# Patient Record
Sex: Female | Born: 1965 | Race: Black or African American | Hispanic: No | Marital: Single | Smoking: Never smoker
Health system: Southern US, Community
[De-identification: ages and names within clinical notes are randomized; demographics above are authoritative.]

## PROBLEM LIST (undated history)

## (undated) DIAGNOSIS — D219 Benign neoplasm of connective and other soft tissue, unspecified: Secondary | ICD-10-CM

## (undated) DIAGNOSIS — M549 Dorsalgia, unspecified: Secondary | ICD-10-CM

## (undated) DIAGNOSIS — J45909 Unspecified asthma, uncomplicated: Secondary | ICD-10-CM

## (undated) DIAGNOSIS — I10 Essential (primary) hypertension: Secondary | ICD-10-CM

## (undated) DIAGNOSIS — G5600 Carpal tunnel syndrome, unspecified upper limb: Secondary | ICD-10-CM

## (undated) DIAGNOSIS — E559 Vitamin D deficiency, unspecified: Secondary | ICD-10-CM

## (undated) HISTORY — DX: Dorsalgia, unspecified: M54.9

## (undated) HISTORY — DX: Unspecified asthma, uncomplicated: J45.909

## (undated) HISTORY — DX: Benign neoplasm of connective and other soft tissue, unspecified: D21.9

## (undated) HISTORY — PX: MYOMECTOMY: SHX85

## (undated) HISTORY — DX: Vitamin D deficiency, unspecified: E55.9

## (undated) HISTORY — DX: Carpal tunnel syndrome, unspecified upper limb: G56.00

---

## 1999-01-16 ENCOUNTER — Other Ambulatory Visit: Admission: RE | Admit: 1999-01-16 | Discharge: 1999-01-16 | Payer: Self-pay | Admitting: Internal Medicine

## 1999-06-21 ENCOUNTER — Other Ambulatory Visit: Admission: RE | Admit: 1999-06-21 | Discharge: 1999-06-21 | Payer: Self-pay | Admitting: Internal Medicine

## 1999-07-04 ENCOUNTER — Encounter (HOSPITAL_BASED_OUTPATIENT_CLINIC_OR_DEPARTMENT_OTHER): Payer: Self-pay | Admitting: General Surgery

## 1999-07-06 ENCOUNTER — Ambulatory Visit (HOSPITAL_COMMUNITY): Admission: RE | Admit: 1999-07-06 | Discharge: 1999-07-06 | Payer: Self-pay | Admitting: General Surgery

## 1999-09-12 ENCOUNTER — Encounter: Payer: Self-pay | Admitting: Emergency Medicine

## 1999-09-12 ENCOUNTER — Emergency Department (HOSPITAL_COMMUNITY): Admission: EM | Admit: 1999-09-12 | Discharge: 1999-09-12 | Payer: Self-pay | Admitting: Emergency Medicine

## 1999-10-11 ENCOUNTER — Encounter: Payer: Self-pay | Admitting: *Deleted

## 1999-10-11 ENCOUNTER — Emergency Department (HOSPITAL_COMMUNITY): Admission: EM | Admit: 1999-10-11 | Discharge: 1999-10-11 | Payer: Self-pay | Admitting: Emergency Medicine

## 1999-10-15 ENCOUNTER — Emergency Department (HOSPITAL_COMMUNITY): Admission: EM | Admit: 1999-10-15 | Discharge: 1999-10-15 | Payer: Self-pay | Admitting: Emergency Medicine

## 2003-09-12 ENCOUNTER — Encounter: Admission: RE | Admit: 2003-09-12 | Discharge: 2003-09-12 | Payer: Self-pay | Admitting: Internal Medicine

## 2004-05-10 ENCOUNTER — Encounter: Admission: RE | Admit: 2004-05-10 | Discharge: 2004-05-10 | Payer: Self-pay | Admitting: Obstetrics and Gynecology

## 2004-05-12 ENCOUNTER — Encounter: Admission: RE | Admit: 2004-05-12 | Discharge: 2004-05-12 | Payer: Self-pay | Admitting: Interventional Radiology

## 2004-05-28 ENCOUNTER — Ambulatory Visit (HOSPITAL_COMMUNITY): Admission: RE | Admit: 2004-05-28 | Discharge: 2004-05-28 | Payer: Self-pay | Admitting: Interventional Radiology

## 2004-05-31 ENCOUNTER — Observation Stay (HOSPITAL_COMMUNITY): Admission: RE | Admit: 2004-05-31 | Discharge: 2004-06-01 | Payer: Self-pay | Admitting: Interventional Radiology

## 2004-10-24 ENCOUNTER — Emergency Department (HOSPITAL_COMMUNITY): Admission: EM | Admit: 2004-10-24 | Discharge: 2004-10-24 | Payer: Self-pay | Admitting: Emergency Medicine

## 2004-11-08 ENCOUNTER — Encounter: Admission: RE | Admit: 2004-11-08 | Discharge: 2004-11-08 | Payer: Self-pay | Admitting: Obstetrics and Gynecology

## 2005-03-25 ENCOUNTER — Emergency Department (HOSPITAL_COMMUNITY): Admission: EM | Admit: 2005-03-25 | Discharge: 2005-03-25 | Payer: Self-pay | Admitting: *Deleted

## 2014-10-14 ENCOUNTER — Encounter: Payer: Self-pay | Admitting: Family Medicine

## 2014-10-14 ENCOUNTER — Ambulatory Visit (INDEPENDENT_AMBULATORY_CARE_PROVIDER_SITE_OTHER): Payer: BLUE CROSS/BLUE SHIELD | Admitting: Family Medicine

## 2014-10-14 VITALS — BP 124/82 | HR 58 | Temp 98.4°F | Resp 16 | Ht 64.5 in | Wt 211.6 lb

## 2014-10-14 DIAGNOSIS — G629 Polyneuropathy, unspecified: Secondary | ICD-10-CM

## 2014-10-14 DIAGNOSIS — Z1389 Encounter for screening for other disorder: Secondary | ICD-10-CM | POA: Diagnosis not present

## 2014-10-14 DIAGNOSIS — Z136 Encounter for screening for cardiovascular disorders: Secondary | ICD-10-CM | POA: Diagnosis not present

## 2014-10-14 DIAGNOSIS — Z0000 Encounter for general adult medical examination without abnormal findings: Secondary | ICD-10-CM

## 2014-10-14 DIAGNOSIS — Z124 Encounter for screening for malignant neoplasm of cervix: Secondary | ICD-10-CM

## 2014-10-14 DIAGNOSIS — Z1383 Encounter for screening for respiratory disorder NEC: Secondary | ICD-10-CM | POA: Diagnosis not present

## 2014-10-14 DIAGNOSIS — Z1321 Encounter for screening for nutritional disorder: Secondary | ICD-10-CM

## 2014-10-14 DIAGNOSIS — G5602 Carpal tunnel syndrome, left upper limb: Secondary | ICD-10-CM | POA: Diagnosis not present

## 2014-10-14 DIAGNOSIS — Z1329 Encounter for screening for other suspected endocrine disorder: Secondary | ICD-10-CM | POA: Diagnosis not present

## 2014-10-14 DIAGNOSIS — R740 Nonspecific elevation of levels of transaminase and lactic acid dehydrogenase [LDH]: Secondary | ICD-10-CM

## 2014-10-14 DIAGNOSIS — R7401 Elevation of levels of liver transaminase levels: Secondary | ICD-10-CM

## 2014-10-14 DIAGNOSIS — Z119 Encounter for screening for infectious and parasitic diseases, unspecified: Secondary | ICD-10-CM

## 2014-10-14 DIAGNOSIS — M62838 Other muscle spasm: Secondary | ICD-10-CM | POA: Diagnosis not present

## 2014-10-14 DIAGNOSIS — E559 Vitamin D deficiency, unspecified: Secondary | ICD-10-CM

## 2014-10-14 DIAGNOSIS — M545 Low back pain, unspecified: Secondary | ICD-10-CM

## 2014-10-14 LAB — POCT URINALYSIS DIPSTICK
Blood, UA: NEGATIVE
Glucose, UA: NEGATIVE
Ketones, UA: NEGATIVE
Leukocytes, UA: NEGATIVE
Nitrite, UA: NEGATIVE
Protein, UA: NEGATIVE
Spec Grav, UA: 1.015
Urobilinogen, UA: 4
pH, UA: 7

## 2014-10-14 LAB — POCT WET PREP WITH KOH
KOH Prep POC: NEGATIVE
RBC Wet Prep HPF POC: NEGATIVE
TRICHOMONAS UA: NEGATIVE
YEAST WET PREP PER HPF POC: NEGATIVE

## 2014-10-14 LAB — COMPREHENSIVE METABOLIC PANEL
ALK PHOS: 119 U/L — AB (ref 39–117)
ALT: 613 U/L — ABNORMAL HIGH (ref 0–35)
AST: 685 U/L — ABNORMAL HIGH (ref 0–37)
Albumin: 3.9 g/dL (ref 3.5–5.2)
BILIRUBIN TOTAL: 1.3 mg/dL — AB (ref 0.2–1.2)
BUN: 14 mg/dL (ref 6–23)
CO2: 27 meq/L (ref 19–32)
Calcium: 9 mg/dL (ref 8.4–10.5)
Chloride: 106 mEq/L (ref 96–112)
Creat: 0.96 mg/dL (ref 0.50–1.10)
GLUCOSE: 65 mg/dL — AB (ref 70–99)
Potassium: 4 mEq/L (ref 3.5–5.3)
Sodium: 141 mEq/L (ref 135–145)
TOTAL PROTEIN: 6.7 g/dL (ref 6.0–8.3)

## 2014-10-14 LAB — CBC
HCT: 38.7 % (ref 36.0–46.0)
HEMOGLOBIN: 13.5 g/dL (ref 12.0–15.0)
MCH: 29.5 pg (ref 26.0–34.0)
MCHC: 34.9 g/dL (ref 30.0–36.0)
MCV: 84.5 fL (ref 78.0–100.0)
MPV: 9.7 fL (ref 8.6–12.4)
PLATELETS: 251 10*3/uL (ref 150–400)
RBC: 4.58 MIL/uL (ref 3.87–5.11)
RDW: 13.5 % (ref 11.5–15.5)
WBC: 4.7 10*3/uL (ref 4.0–10.5)

## 2014-10-14 LAB — HEPATITIS C ANTIBODY: HCV AB: NEGATIVE

## 2014-10-14 LAB — VITAMIN D 25 HYDROXY (VIT D DEFICIENCY, FRACTURES): VIT D 25 HYDROXY: 19 ng/mL — AB (ref 30–100)

## 2014-10-14 LAB — HIV ANTIBODY (ROUTINE TESTING W REFLEX): HIV: NONREACTIVE

## 2014-10-14 LAB — POCT GLYCOSYLATED HEMOGLOBIN (HGB A1C): HEMOGLOBIN A1C: 5.6

## 2014-10-14 LAB — RPR

## 2014-10-14 LAB — TSH: TSH: 0.903 u[IU]/mL (ref 0.350–4.500)

## 2014-10-14 MED ORDER — CYCLOBENZAPRINE HCL 10 MG PO TABS: 10.0000 mg | ORAL_TABLET | Freq: Every day | ORAL | Status: DC

## 2014-10-14 MED ORDER — MELOXICAM 15 MG PO TABS: 15.0000 mg | ORAL_TABLET | Freq: Every day | ORAL | Status: DC

## 2014-10-14 NOTE — Progress Notes (Deleted)
   Subjective:    Patient ID: Megan Gardner, female    DOB: [birth year], 49 y.o.   MRN: [id number]  HPI    Review of Systems  Constitutional: Negative.   HENT: Negative.   Eyes: Negative.   Respiratory: Negative.   Cardiovascular: Negative.   Gastrointestinal: Negative.   Endocrine: Negative.   Genitourinary: Negative.   Musculoskeletal: Positive for back pain and arthralgias.  Skin: Negative.   Allergic/Immunologic: Negative.   Neurological: Negative.   Hematological: Negative.   Psychiatric/Behavioral: Negative.        Objective:   Physical Exam        Assessment & Plan:

## 2014-10-14 NOTE — Patient Instructions (Signed)
\Carpal Tunnel Syndrome Carpal tunnel syndrome is a disorder of the nervous system in the wrist that causes pain, hand weakness, and/or loss of feeling. Carpal tunnel syndrome is caused by the compression, stretching, or irritation of the median nerve at the wrist joint. Athletes who experience carpal tunnel syndrome may notice a decrease in their performance to the condition, especially for sports that require strong hand or wrist action.  SYMPTOMS   Tingling, numbness, or burning pain in the hand or fingers.  Inability to sleep due to pain in the hand.  Sharp pains that shoot from the wrist up the arm or to the fingers, especially at night.  Morning stiffness or cramping of the hand.  Thumb weakness, resulting in difficulty holding objects or making a fist.  Shiny, dry skin on the hand.  Reduced performance in any sport requiring a strong grip. CAUSES   Median nerve damage at the wrist is caused by pressure due to swelling, inflammation, or scarred tissue.  Sources of pressure include:  Repetitive gripping or squeezing that causes inflammation of the tendon sheaths.  Scarring or shortening of the ligament that covers the median nerve.  Traumatic injury to the wrist or forearm such as fracture, sprain, or dislocation.  Prolonged hyperextension (wrist bent backward) or hyperflexion (wrist bent downward) of the wrist. RISK INCREASES WITH:  Diabetes mellitus.  Menopause or amenorrhea.  Rheumatoid arthritis.  Raynaud disease.  Pregnancy.  Gout.  Kidney disease.  Ganglion cyst.  Repetitive hand or wrist action.  Hypothyroidism (underactive thyroid gland).  Repetitive jolting or shaking of the hands or wrist.  Prolonged forceful weight-bearing on the hands. PREVENTION  Bracing the hand and wrist straight during activities that involve repetitive grasping.  For activities that require prolonged extension of the wrist (bending towards the top of the forearm)  periodically change the position of your wrists.  Learn and use proper technique in activities that result in the wrist position in neutral to slight extension.  Avoid bending the wrist into full extension or flexion (up or down).  Keep the wrist in a straight (neutral) position. To keep the wrist in this position, wear a splint.  Avoid repetitive hand and wrist motions.  When possible avoid prolonged grasping of items (steering wheel of a car, a pen, a vacuum cleaner, or a rake).  Loosen your grip for activities that require prolonged grasping of items.  Place keyboards and writing surfaces at the correct height as to decrease strain on the wrist and hand.  Alternate work tasks to avoid prolonged wrist flexion.  Avoid pinching activities (needlework and writing) as they may irritate your carpal tunnel syndrome.  If these activities are necessary, complete them for shorter periods of time.  When writing, use a felt tip or rollerball pen and/or build up the grip on a pen to decrease the forces required for writing. PROGNOSIS  Carpal tunnel syndrome is usually curable with appropriate conservative treatment and sometimes resolves spontaneously. For some cases, surgery is necessary, especially if muscle wasting or nerve changes have developed.  RELATED COMPLICATIONS   Permanent numbness and a weak thumb or fingers in the affected hand.  Permanent paralysis of a portion of the hand and fingers. TREATMENT  Treatment initially consists of stopping activities that aggravate the symptoms as well as medication and ice to reduce inflammation. A wrist splint is often recommended for wear during activities of repetitive motion as well as at night. It is also important to learn and use proper technique when  performing activities that typically cause pain. On occasion, a corticosteroid injection may be given. If symptoms persist despite conservative treatment, surgery may be an option. Surgical  techniques free the pinched or compressed nerve. Carpal tunnel surgery is usually performed on an outpatient basis, meaning you go home the same day as surgery. These procedures provide almost complete relief of all symptoms in 95% of patients. Expect at least 2 weeks for healing after surgery. For cases that are the result of repeated jolting or shaking of the hand or wrist or prolonged hyperextension, surgery is not usually recommended because stretching of the median nerve, not compression, is usually the cause of carpal tunnel syndrome in these cases. MEDICATION   If pain medication is necessary, nonsteroidal anti-inflammatory medications, such as aspirin and ibuprofen, or other minor pain relievers, such as acetaminophen, are often recommended.  Do not take pain medication for 7 days before surgery.  Prescription pain relievers are usually only prescribed after surgery. Use only as directed and only as much as you need.  Corticosteroid injections may be given to reduce inflammation. However, they are not always recommended.  Vitamin B6 (pyridoxine) may reduce symptoms; use only if prescribed for your disorder. SEEK MEDICAL CARE IF:   Symptoms get worse or do not improve in 2 weeks despite treatment.  You also have a current or recent history of neck or shoulder injury that has resulted in pain or tingling elsewhere in your arm. Document Released: 04/08/2005 Document Revised: 08/23/2013 Document Reviewed: 07/21/2008 [facility] Patient Information 2015 [place], [place]. This information is not intended to replace advice given to you by your health care provider. Make sure you discuss any questions you have with your health care provider. Health Maintenance Adopting a healthy lifestyle and getting preventive care can go a long way to promote health and wellness. Talk with your health care provider about what schedule of regular examinations is right for you. This is a good chance for you to check  in with your provider about disease prevention and staying healthy. In between checkups, there are plenty of things you can do on your own. Experts have done a lot of research about which lifestyle changes and preventive measures are most likely to keep you healthy. Ask your health care provider for more information. WEIGHT AND DIET  Eat a healthy diet  Be sure to include plenty of vegetables, fruits, low-fat dairy products, and lean protein.  Do not eat a lot of foods high in solid fats, added sugars, or salt.  Get regular exercise. This is one of the most important things you can do for your health.  Most adults should exercise for at least 150 minutes each week. The exercise should increase your heart rate and make you sweat (moderate-intensity exercise).  Most adults should also do strengthening exercises at least twice a week. This is in addition to the moderate-intensity exercise.  Maintain a healthy weight  Body mass index (BMI) is a measurement that can be used to identify possible weight problems. It estimates body fat based on height and weight. Your health care provider can help determine your BMI and help you achieve or maintain a healthy weight.  For females 42 years of age and older:   A BMI below 18.5 is considered underweight.  A BMI of 18.5 to 24.9 is normal.  A BMI of 25 to 29.9 is considered overweight.  A BMI of 30 and above is considered obese.  Watch levels of cholesterol and blood lipids  You should start having your blood tested for lipids and cholesterol at 49 years of age, then have this test every 5 years.  You may need to have your cholesterol levels checked more often if:  Your lipid or cholesterol levels are high.  You are older than 49 years of age.  You are at high risk for heart disease.  CANCER SCREENING   Lung Cancer  Lung cancer screening is recommended for adults 70-71 years old who are at high risk for lung cancer because of a  history of smoking.  A yearly low-dose CT scan of the lungs is recommended for people who:  Currently smoke.  Have quit within the past 15 years.  Have at least a 30-pack-year history of smoking. A pack year is smoking an average of one pack of cigarettes a day for 1 year.  Yearly screening should continue until it has been 15 years since you quit.  Yearly screening should stop if you develop a health problem that would prevent you from having lung cancer treatment.  Breast Cancer  Practice breast self-awareness. This means understanding how your breasts normally appear and feel.  It also means doing regular breast self-exams. Let your health care provider know about any changes, no matter how small.  If you are in your 20s or 30s, you should have a clinical breast exam (CBE) by a health care provider every 1-3 years as part of a regular health exam.  If you are 14 or older, have a CBE every year. Also consider having a breast X-ray (mammogram) every year.  If you have a family history of breast cancer, talk to your health care provider about genetic screening.  If you are at high risk for breast cancer, talk to your health care provider about having an MRI and a mammogram every year.  Breast cancer gene (BRCA) assessment is recommended for women who have family members with BRCA-related cancers. BRCA-related cancers include:  Breast.  Ovarian.  Tubal.  Peritoneal cancers.  Results of the assessment will determine the need for genetic counseling and BRCA1 and BRCA2 testing. Cervical Cancer Routine pelvic examinations to screen for cervical cancer are no longer recommended for nonpregnant women who are considered low risk for cancer of the pelvic organs (ovaries, uterus, and vagina) and who do not have symptoms. A pelvic examination may be necessary if you have symptoms including those associated with pelvic infections. Ask your health care provider if a screening pelvic exam  is right for you.   The Pap test is the screening test for cervical cancer for women who are considered at risk.  If you had a hysterectomy for a problem that was not cancer or a condition that could lead to cancer, then you no longer need Pap tests.  If you are older than 65 years, and you have had normal Pap tests for the past 10 years, you no longer need to have Pap tests.  If you have had past treatment for cervical cancer or a condition that could lead to cancer, you need Pap tests and screening for cancer for at least 20 years after your treatment.  If you no longer get a Pap test, assess your risk factors if they change (such as having a new sexual partner). This can affect whether you should start being screened again.  Some women have medical problems that increase their chance of getting cervical cancer. If this is the case for you, your health care provider may recommend more  frequent screening and Pap tests.  The human papillomavirus (HPV) test is another test that may be used for cervical cancer screening. The HPV test looks for the virus that can cause cell changes in the cervix. The cells collected during the Pap test can be tested for HPV.  The HPV test can be used to screen women 70 years of age and older. Getting tested for HPV can extend the interval between normal Pap tests from three to five years.  An HPV test also should be used to screen women of any age who have unclear Pap test results.  After 49 years of age, women should have HPV testing as often as Pap tests.  Colorectal Cancer  This type of cancer can be detected and often prevented.  Routine colorectal cancer screening usually begins at 49 years of age and continues through 49 years of age.  Your health care provider may recommend screening at an earlier age if you have risk factors for colon cancer.  Your health care provider may also recommend using home test kits to check for hidden blood in the  stool.  A small camera at the end of a tube can be used to examine your colon directly (sigmoidoscopy or colonoscopy). This is done to check for the earliest forms of colorectal cancer.  Routine screening usually begins at age 6.  Direct examination of the colon should be repeated every 5-10 years through 49 years of age. However, you may need to be screened more often if early forms of precancerous polyps or small growths are found. Skin Cancer  Check your skin from head to toe regularly.  Tell your health care provider about any new moles or changes in moles, especially if there is a change in a mole's shape or color.  Also tell your health care provider if you have a mole that is larger than the size of a pencil eraser.  Always use sunscreen. Apply sunscreen liberally and repeatedly throughout the day.  Protect yourself by wearing long sleeves, pants, a wide-brimmed hat, and sunglasses whenever you are outside. HEART DISEASE, DIABETES, AND HIGH BLOOD PRESSURE   Have your blood pressure checked at least every 1-2 years. High blood pressure causes heart disease and increases the risk of stroke.  If you are between 55 years and 35 years old, ask your health care provider if you should take aspirin to prevent strokes.  Have regular diabetes screenings. This involves taking a blood sample to check your fasting blood sugar level.  If you are at a normal weight and have a low risk for diabetes, have this test once every three years after 49 years of age.  If you are overweight and have a high risk for diabetes, consider being tested at a younger age or more often. PREVENTING INFECTION  Hepatitis B  If you have a higher risk for hepatitis B, you should be screened for this virus. You are considered at high risk for hepatitis B if:  You were born in a country where hepatitis B is common. Ask your health care provider which countries are considered high risk.  Your parents were born in  a high-risk country, and you have not been immunized against hepatitis B (hepatitis B vaccine).  You have HIV or AIDS.  You use needles to inject street drugs.  You live with someone who has hepatitis B.  You have had sex with someone who has hepatitis B.  You get hemodialysis treatment.  You take certain  medicines for conditions, including cancer, organ transplantation, and autoimmune conditions. Hepatitis C  Blood testing is recommended for:  Everyone born from [birth year] through 1965.  Anyone with known risk factors for hepatitis C. Sexually transmitted infections (STIs)  You should be screened for sexually transmitted infections (STIs) including gonorrhea and chlamydia if:  You are sexually active and are younger than 49 years of age.  You are older than 49 years of age and your health care provider tells you that you are at risk for this type of infection.  Your sexual activity has changed since you were last screened and you are at an increased risk for chlamydia or gonorrhea. Ask your health care provider if you are at risk.  If you do not have HIV, but are at risk, it may be recommended that you take a prescription medicine daily to prevent HIV infection. This is called pre-exposure prophylaxis (PrEP). You are considered at risk if:  You are sexually active and do not regularly use condoms or know the HIV status of your partner(s).  You take drugs by injection.  You are sexually active with a partner who has HIV. Talk with your health care provider about whether you are at high risk of being infected with HIV. If you choose to begin PrEP, you should first be tested for HIV. You should then be tested every 3 months for as long as you are taking PrEP.  PREGNANCY   If you are premenopausal and you may become pregnant, ask your health care provider about preconception counseling.  If you may become pregnant, take 400 to 800 micrograms (mcg) of folic acid every day.  If you  want to prevent pregnancy, talk to your health care provider about birth control (contraception). OSTEOPOROSIS AND MENOPAUSE   Osteoporosis is a disease in which the bones lose minerals and strength with aging. This can result in serious bone fractures. Your risk for osteoporosis can be identified using a bone density scan.  If you are 60 years of age or older, or if you are at risk for osteoporosis and fractures, ask your health care provider if you should be screened.  Ask your health care provider whether you should take a calcium or vitamin D supplement to lower your risk for osteoporosis.  Menopause may have certain physical symptoms and risks.  Hormone replacement therapy may reduce some of these symptoms and risks. Talk to your health care provider about whether hormone replacement therapy is right for you.  HOME CARE INSTRUCTIONS   Schedule regular health, dental, and eye exams.  Stay current with your immunizations.   Do not use any tobacco products including cigarettes, chewing tobacco, or electronic cigarettes.  If you are pregnant, do not drink alcohol.  If you are breastfeeding, limit how much and how often you drink alcohol.  Limit alcohol intake to no more than 1 drink per day for nonpregnant women. One drink equals 12 ounces of beer, 5 ounces of wine, or 1 ounces of hard liquor.  Do not use street drugs.  Do not share needles.  Ask your health care provider for help if you need support or information about quitting drugs.  Tell your health care provider if you often feel depressed.  Tell your health care provider if you have ever been abused or do not feel safe at home. Document Released: 10/22/2010 Document Revised: 08/23/2013 Document Reviewed: 03/10/2013 [facility] Patient Information 2015 [place], [place]. This information is not intended to replace advice given to you  by your health care provider. Make sure you discuss any questions you have with your health  care provider. Back Pain, Adult Low back pain is very common. About 1 in 5 people have back pain.The cause of low back pain is rarely dangerous. The pain often gets better over time.About half of people with a sudden onset of back pain feel better in just 2 weeks. About 8 in 10 people feel better by 6 weeks.  CAUSES Some common causes of back pain include:  Strain of the muscles or ligaments supporting the spine.  Wear and tear (degeneration) of the spinal discs.  Arthritis.  Direct injury to the back. DIAGNOSIS Most of the time, the direct cause of low back pain is not known.However, back pain can be treated effectively even when the exact cause of the pain is unknown.Answering your caregiver's questions about your overall health and symptoms is one of the most accurate ways to make sure the cause of your pain is not dangerous. If your caregiver needs more information, he or she may order lab work or imaging tests (X-rays or MRIs).However, even if imaging tests show changes in your back, this usually does not require surgery. HOME CARE INSTRUCTIONS For many people, back pain returns.Since low back pain is rarely dangerous, it is often a condition that people can learn to [facility] their own.   Remain active. It is stressful on the back to sit or stand in one place. Do not sit, drive, or stand in one place for more than 30 minutes at a time. Take short walks on level surfaces as soon as pain allows.Try to increase the length of time you walk each day.  Do not stay in bed.Resting more than 1 or 2 days can delay your recovery.  Do not avoid exercise or work.Your body is made to move.It is not dangerous to be active, even though your back may hurt.Your back will likely heal faster if you return to being active before your pain is gone.  Pay attention to your body when you bend and lift. Many people have less discomfortwhen lifting if they bend their knees, keep the load close to  their bodies,and avoid twisting. Often, the most comfortable positions are those that put less stress on your recovering back.  Find a comfortable position to sleep. Use a firm mattress and lie on your side with your knees slightly bent. If you lie on your back, put a pillow under your knees.  Only take over-the-counter or prescription medicines as directed by your caregiver. Over-the-counter medicines to reduce pain and inflammation are often the most helpful.Your caregiver may prescribe muscle relaxant drugs.These medicines help dull your pain so you can more quickly return to your normal activities and healthy exercise.  Put ice on the injured area.  Put ice in a plastic bag.  Place a towel between your skin and the bag.  Leave the ice on for 15-20 minutes, 03-04 times a day for the first 2 to 3 days. After that, ice and heat may be alternated to reduce pain and spasms.  Ask your caregiver about trying back exercises and gentle massage. This may be of some benefit.  Avoid feeling anxious or stressed.Stress increases muscle tension and can worsen back pain.It is important to recognize when you are anxious or stressed and learn ways to manage it.Exercise is a great option. SEEK MEDICAL CARE IF:  You have pain that is not relieved with rest or medicine.  You have pain that  does not improve in 1 week.  You have new symptoms.  You are generally not feeling well. SEEK IMMEDIATE MEDICAL CARE IF:   You have pain that radiates from your back into your legs.  You develop new bowel or bladder control problems.  You have unusual weakness or numbness in your arms or legs.  You develop nausea or vomiting.  You develop abdominal pain.  You feel faint. Document Released: 04/08/2005 Document Revised: 10/08/2011 Document Reviewed: 08/10/2013 [facility] Patient Information 2015 [place], [place]. This information is not intended to replace advice given to you by your health care provider.  Make sure you discuss any questions you have with your health care provider.

## 2014-10-14 NOTE — Progress Notes (Addendum)
Subjective:    Patient ID: Megan Gardner, female    DOB: 02-25-1966, 49 y.o.   MRN: [id number] This chart was scribed for Megan Cheadle, MD by Megan Gardner, Medical Scribe. This patient was seen in Room 25 and the patient's care was started at 9:16 AM.   Chief Complaint  Patient presents with  . Annual Exam    HPI HPI Comments: Megan Gardner is a 49 y.o. female who presents to the [facility] for a complete physical exam. Patient is not fasting today. She ate some chicken soup this morning.  Here for CPE. I have not seen pt prior and she is completley new to [facility] as well. She does not have any info in [facility] system other than ER reports from 2000-2006.  Patient is not on any regular medications.   Back Pain: Patient reports having right-sided lower back pain that started 1-2 months ago. The pain is worse in the mornings and tends to resolve throughout the day. She has been taking Icy Hot and Aleve with relief. She did take Aleve this morning, so she does not have pain currently. Patient denies leg pain, weakness, numbness, and bowel or bladder symptoms. She also denies prior back injuries and prior imaging of her back. She has not seen a chiropractor. Patient does note having an old mattress and believes it is time to get a new one.  Tingling in Fingers: She also reports having some occasional tingling in all of her fingers that started about 2 months ago. Patient denies dropping things, losing grasp, and weakness. She does not do much typing or writing.  Cancer Screening: No history of abnormal pap smears per patient. Her PCP is Dr. Karlton Gardner.  STD Testing: Patient would like to be tested for STD's. She denies any urinary symptoms and vaginal symptoms. She typically has one episode of nocturia at baseline.  Immunizations: She believes her last tetanus shot was 2 years ago.  Vitamins/Supplements: She does take a One-A-Day Women's multivitamin daily.  Exercise: Patient plays  football for exercise.  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  History reviewed. No pertinent past medical history. History reviewed. No pertinent past surgical history. History reviewed. No pertinent family history.  History   Social History  . Marital Status: Single    Spouse Name: N/A  . Number of Children: N/A  . Years of Education: N/A   Occupational History  . Not on file.   Social History Main Topics  . Smoking status: Never Smoker   . Smokeless tobacco: Not on file  . Alcohol Use: 0.6 oz/week    0 Standard drinks or equivalent, 1 Glasses of wine per week  . Drug Use: No  . Sexual Activity: Not on file   Other Topics Concern  . Not on file   Social History Narrative  . No narrative on file    No current outpatient prescriptions on file prior to visit.   No current facility-administered medications on file prior to visit.   No Known Allergies   Review of Systems  Musculoskeletal: Positive for back pain and arthralgias.  All other systems reviewed and are negative.  13 point ROS reviewed on patient health survey. Negative other than listed above or in nursing note. See nursing note.      Objective:  BP 124/82 mmHg  Gardner 58  Temp(Src) 98.4 F (36.9 C) (Oral)  Resp 16  Ht 5' 4.5" (1.638 m)  Wt 211 lb  9.6 oz (95.981 kg)  BMI 35.77 kg/m2  SpO2 100%  LMP 10/03/2014  Physical Exam  Constitutional: She is oriented to person, place, and time. She appears well-developed and well-nourished. No distress.  HENT:  Head: Normocephalic and atraumatic.  Right Ear: Tympanic membrane is injected and erythematous.  Left Ear: Tympanic membrane is injected and erythematous.  Nose: Mucosal edema and rhinorrhea present.  Mouth/Throat: Oropharynx is clear and moist. No oropharyngeal exudate.  Nasal mucosal edema and purulent rhinorrhea. Oropharynx with postnasal drip.  Eyes: Pupils are equal, round, and reactive to light.  Neck: Neck supple. No thyromegaly  present.  Cardiovascular: Normal rate, regular rhythm, S1 normal, S2 normal and normal heart sounds.   No murmur heard. Pulses:      Radial pulses are 2+ on the right side, and 2+ on the left side.  2+ ulnar pulses bilaterally.  Pulmonary/Chest: Effort normal and breath sounds normal. No respiratory distress. She has no wheezes. She has no rales.  Clear to auscultation bilaterally. Good air movement.  Genitourinary: Uterus normal. Vaginal discharge found.  Several symmetric, mobile, non-tender, 1 cm nodules bilaterally, most over lower half and outer quadrant of breasts, consistent with fibrocystic breasts. Normal labia minora and majora. Small amount of thick, white, frothy vaginal discharge. Normal vaginal vault and cervix. Normal uterus and adnexa on bimanual exam.  Musculoskeletal: She exhibits no edema.  Negative straight leg raise bilaterally. Palpable spasm above iliac crest bilaterally. No paraspinal tenderness.  Lymphadenopathy:    She has no cervical adenopathy.       Right: No supraclavicular adenopathy present.       Left: No supraclavicular adenopathy present.  Neurological: She is alert and oriented to person, place, and time. No cranial nerve deficit.  Reflex Scores:      Tricep reflexes are 1+ on the right side and 1+ on the left side.      Bicep reflexes are 1+ on the right side and 1+ on the left side.      Brachioradialis reflexes are 1+ on the right side and 1+ on the left side.      Patellar reflexes are 2+ on the right side and 2+ on the left side.      Achilles reflexes are 2+ on the right side and 2+ on the left side. Positive Tinel's on the left, negative on the right. Negative Phalen's and reverse Phalen's. Normal thenar eminence. Opposition grasp strength 5/5.  Skin: Skin is warm and dry. No rash noted.  Hyperpigmented 1 cm nodule on right medial ankle.  Psychiatric: She has a normal mood and affect. Her behavior is normal.  Vitals reviewed.  [facility] reading  (PRIMARY) by  Dr. Brigitte Gardner. EKG NSR, no ischemic change     Assessment & Plan:  Give patient a copy of the physical per her request. 1. Routine health maintenance   2. Screening for cardiovascular, respiratory, and genitourinary diseases   3. Screening examination for infectious disease   4. Screening for cervical cancer - no h/o abnml, done 1 yr prior - recheck 2018  5. Screening for thyroid disorder   6. Encounter for vitamin deficiency screening   7. Right-sided low back pain without sciatica   8. Muscle spasm   9. Carpal tunnel syndrome of left wrist - wear qhs splint given in office  10. Neuropathy     Orders Placed This Encounter  Procedures  . GC/Chlamydia Probe Amp  . CBC  . Comprehensive metabolic panel    Order Specific  Question:  Has the patient fasted?    Answer:  Yes  . TSH  . Vit D  25 hydroxy (rtn osteoporosis monitoring)  . RPR  . Hepatitis C antibody  . HIV antibody  . Lipid panel    Standing Status: Future     Number of Occurrences:      Standing Expiration Date: 10/14/2015    Order Specific Question:  Has the patient fasted?    Answer:  Yes  . POCT urinalysis dipstick  . POCT glycosylated hemoglobin (Hb A1C)  . POCT Wet Prep with KOH  . EKG 12-Lead    Meds ordered this encounter  Medications  . cyclobenzaprine (FLEXERIL) 10 MG tablet    Sig: Take 1 tablet (10 mg total) by mouth at bedtime.    Dispense:  30 tablet    Refill:  1  . meloxicam (MOBIC) 15 MG tablet    Sig: Take 1 tablet (15 mg total) by mouth daily. Do not use with any other otc pain medication other than acetaminophen    Dispense:  30 tablet    Refill:  1    Do not use with any other otc pain medication other than tylenol/acetaminophen - so no aleve, ibuprofen, motrin, advil, etc.    I personally performed the services described in this documentation, which was scribed in my presence. The recorded information has been reviewed and considered, and addended by me as needed.  Megan Cheadle,  MD MPH

## 2014-10-14 NOTE — Progress Notes (Deleted)
Patient ID: Megan Gardner, female   DOB: 1965-06-14, 49 y.o.   MRN: [id number] Here for CPE.  I have not seen pt prior and she is completley new to [facility] as well. She does not have any info in [facility] system other than ER reports from 2000-2006.

## 2014-10-15 LAB — GC/CHLAMYDIA PROBE AMP
CT Probe RNA: NEGATIVE
GC Probe RNA: NEGATIVE

## 2014-10-20 ENCOUNTER — Other Ambulatory Visit (INDEPENDENT_AMBULATORY_CARE_PROVIDER_SITE_OTHER): Payer: BLUE CROSS/BLUE SHIELD

## 2014-10-20 DIAGNOSIS — Z1383 Encounter for screening for respiratory disorder NEC: Secondary | ICD-10-CM

## 2014-10-20 DIAGNOSIS — Z1389 Encounter for screening for other disorder: Secondary | ICD-10-CM | POA: Diagnosis not present

## 2014-10-20 DIAGNOSIS — Z136 Encounter for screening for cardiovascular disorders: Secondary | ICD-10-CM

## 2014-10-20 LAB — LIPID PANEL
Cholesterol: 149 mg/dL (ref 0–200)
HDL: 51 mg/dL (ref >=46)
LDL Cholesterol: 93 mg/dL (ref 0–99)
Total CHOL/HDL Ratio: 2.9 Ratio
Triglycerides: 27 mg/dL (ref <150)
VLDL: 5 mg/dL (ref 0–40)

## 2014-10-20 NOTE — Addendum Note (Signed)
Addended by: Delman Cheadle on: 10/20/2014 01:03 AM   Modules accepted: Miquel Dunn

## 2014-10-20 NOTE — Progress Notes (Unsigned)
Pt is here for lab work only. 

## 2014-10-22 ENCOUNTER — Encounter: Payer: Self-pay | Admitting: Family Medicine

## 2014-10-22 DIAGNOSIS — G5602 Carpal tunnel syndrome, left upper limb: Secondary | ICD-10-CM | POA: Insufficient documentation

## 2014-10-22 DIAGNOSIS — R740 Nonspecific elevation of levels of transaminase and lactic acid dehydrogenase [LDH]: Secondary | ICD-10-CM

## 2014-10-22 DIAGNOSIS — E559 Vitamin D deficiency, unspecified: Secondary | ICD-10-CM | POA: Insufficient documentation

## 2014-10-22 DIAGNOSIS — R7401 Elevation of levels of liver transaminase levels: Secondary | ICD-10-CM | POA: Insufficient documentation

## 2014-10-22 MED ORDER — ERGOCALCIFEROL 1.25 MG (50000 UT) PO CAPS: 50000.0000 [IU] | ORAL_CAPSULE | ORAL | Status: DC

## 2014-10-22 NOTE — Addendum Note (Signed)
Addended by: Delman Cheadle on: 10/22/2014 06:41 AM   Modules accepted: Orders, SmartSet

## 2014-10-24 ENCOUNTER — Telehealth: Payer: Self-pay | Admitting: *Deleted

## 2014-10-25 ENCOUNTER — Ambulatory Visit (INDEPENDENT_AMBULATORY_CARE_PROVIDER_SITE_OTHER): Payer: BLUE CROSS/BLUE SHIELD | Admitting: Family Medicine

## 2014-10-25 ENCOUNTER — Encounter: Payer: Self-pay | Admitting: Family Medicine

## 2014-10-25 VITALS — BP 136/86 | HR 73 | Temp 98.3°F | Resp 15 | Ht 64.5 in | Wt 211.0 lb

## 2014-10-25 DIAGNOSIS — B9689 Other specified bacterial agents as the cause of diseases classified elsewhere: Secondary | ICD-10-CM

## 2014-10-25 DIAGNOSIS — N760 Acute vaginitis: Secondary | ICD-10-CM

## 2014-10-25 DIAGNOSIS — E559 Vitamin D deficiency, unspecified: Secondary | ICD-10-CM | POA: Diagnosis not present

## 2014-10-25 DIAGNOSIS — R740 Nonspecific elevation of levels of transaminase and lactic acid dehydrogenase [LDH]: Secondary | ICD-10-CM | POA: Diagnosis not present

## 2014-10-25 DIAGNOSIS — A499 Bacterial infection, unspecified: Secondary | ICD-10-CM | POA: Diagnosis not present

## 2014-10-25 DIAGNOSIS — R7401 Elevation of levels of liver transaminase levels: Secondary | ICD-10-CM

## 2014-10-25 LAB — HEPATIC FUNCTION PANEL
ALBUMIN: 3.6 g/dL (ref 3.5–5.2)
ALK PHOS: 72 U/L (ref 39–117)
ALT: 32 U/L (ref 0–35)
AST: 18 U/L (ref 0–37)
BILIRUBIN DIRECT: 0.1 mg/dL (ref 0.0–0.3)
BILIRUBIN TOTAL: 0.5 mg/dL (ref 0.2–1.2)
Indirect Bilirubin: 0.4 mg/dL (ref 0.2–1.2)
Total Protein: 6.2 g/dL (ref 6.0–8.3)

## 2014-10-25 LAB — C-REACTIVE PROTEIN

## 2014-10-25 LAB — GAMMA GT: GGT: 91 U/L — ABNORMAL HIGH (ref 7–51)

## 2014-10-25 LAB — CK: Total CK: 245 U/L — ABNORMAL HIGH (ref 7–177)

## 2014-10-25 LAB — LIPASE: LIPASE: 13 U/L (ref 0–75)

## 2014-10-25 MED ORDER — METRONIDAZOLE 500 MG PO TABS: 500.0000 mg | ORAL_TABLET | Freq: Two times a day (BID) | ORAL | Status: DC

## 2014-10-25 NOTE — Progress Notes (Signed)
Subjective:    Patient ID: Megan Gardner, female    DOB: 07-03-1965, 49 y.o.   MRN: [id number] This chart was scribed for Delman Cheadle, MD by Zola Button, Medical Scribe. This patient was seen in Room 2 and the patient's care was started at 1:33 PM.   Chief Complaint  Patient presents with  . Follow-up    Back pain and blood work    HPI HPI Comments: Megan Gardner is a 49 y.o. female with a hx of vitamin D deficiency who presents to the [facility] for a follow-up.  Patient does drink beer frequently, but she states she does not have a problem stopping. No history of alcohol addition per patient. At her complete physical 2 weeks ago, she was found to have vitamin D deficiency with vitamin D of 19, started on high-dose replacement. She was found to have bacterial vaginosis which has not been treated. Normal A1c, TSH, RPR, and GC chlamydia. Negative hepatitis C and HIV. Normal UA and CBC. Normal liver function, but AST was 685, ALT 613. She is unsure if she was vaccinated against hepatitis A or B. She was not fasting at that time, but she is fasting today. She came in a week after her visit for a fasting lipid panel that was normal. Triglycerides were 27. She has not used any Tylenol, but she uses Aleve for aches and pains. No personal or family history of liver problems. No change to skin tone, abdominal distention, bowel sounds or urination.  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  No past medical history on file. No past surgical history on file. Current Outpatient Prescriptions on File Prior to Visit  Medication Sig Dispense Refill  . cyclobenzaprine (FLEXERIL) 10 MG tablet Take 1 tablet (10 mg total) by mouth at bedtime. 30 tablet 1  . ergocalciferol (VITAMIN D2) 50000 UNITS capsule Take 1 capsule (50,000 Units total) by mouth once a week. 12 capsule 1  . meloxicam (MOBIC) 15 MG tablet Take 1 tablet (15 mg total) by mouth daily. Do not use  with any other otc pain medication other than acetaminophen 30 tablet 1   No current facility-administered medications on file prior to visit.   No Known Allergies No family history on file. History   Social History  . Marital Status: Single    Spouse Name: N/A  . Number of Children: N/A  . Years of Education: N/A   Social History Main Topics  . Smoking status: Never Smoker   . Smokeless tobacco: Not on file  . Alcohol Use: 0.6 oz/week    0 Standard drinks or equivalent, 1 Glasses of wine per week  . Drug Use: No  . Sexual Activity: Not on file   Other Topics Concern  . None   Social History Narrative     Review of Systems  Constitutional: Negative for fever, chills, activity change, appetite change and unexpected weight change.  Gastrointestinal: Negative.  Negative for nausea, vomiting, abdominal pain, diarrhea, constipation and abdominal distention.  Genitourinary: Negative.  Negative for urgency, decreased urine volume and pelvic pain.  Musculoskeletal: Positive for back pain.  Skin: Negative for color change.  Hematological: Negative for adenopathy. Does not bruise/bleed easily.  Psychiatric/Behavioral: Negative for self-injury and dysphoric mood.       Objective:  BP 136/86 mmHg  Pulse 73  Temp(Src) 98.3 F (36.8 C) (Oral)  Resp 15  Ht 5' 4.5" (1.638 m)  Wt 211 lb (95.709 kg)  BMI 35.67 kg/m2  SpO2 99%  LMP 10/23/2014  Physical Exam  Constitutional: She is oriented to person, place, and time. She appears well-developed and well-nourished. No distress.  HENT:  Head: Normocephalic and atraumatic.  Mouth/Throat: Oropharynx is clear and moist. No oropharyngeal exudate.  Eyes: Pupils are equal, round, and reactive to light.  Neck: Neck supple.  Cardiovascular: Normal rate.   Pulmonary/Chest: Effort normal.  Musculoskeletal: She exhibits no edema.  Neurological: She is alert and oriented to person, place, and time. No cranial nerve deficit.  Skin: Skin is  warm and dry. No rash noted.  Poorly defined subcutaneous mass approximately 1 cm at left upper quadrant near midline, palpable on exam. Mobile and non-tender. No fluctuance or induration. Patient had not noticed prior.  Psychiatric: She has a normal mood and affect. Her behavior is normal.  Vitals reviewed.         Assessment & Plan:   1. Transaminitis - unknown etiology - occ EtOH and tylenol but in no significant amount per pt.  Has stopped both completely for the past wk so recheck lfts today along with viral hep panel.  [place] P.  2. Vitamin D deficiency   3. Bacterial vaginosis - pt asymptomatic - was found on wet prep at her CPE 2 wks ago - start flagyl    Orders Placed This Encounter  Procedures  . US Abdomen Limited RUQ    211LBS/NO NEEDS/INS/BCBS/HB/PT W/EPIC    Standing Status: Future     Number of Occurrences:      Standing Expiration Date: 12/26/2015    Order Specific Question:  Reason for Exam (SYMPTOM  OR DIAGNOSIS REQUIRED)    Answer:  elevated ast/alt    Order Specific Question:  Preferred imaging location?    Answer:  [facility]  . Hepatic Function Panel  . Hepatitis B surface antigen  . Hepatitis B surface antibody  . Hepatitis A Antibody, IGM  . Hepatitis A Antibody, Total  . Hepatitis B core antibody, total  . Gamma GT  . Lipase  . Aldolase  . CK  . ANA  . Hepatitis C RNA quantitative  . Sedimentation Rate  . C-reactive protein  . Ambulatory referral to Gastroenterology    Referral Priority:  Routine    Referral Type:  Consultation    Referral Reason:  Specialty Services Required    Number of Visits Requested:  1    Meds ordered this encounter  Medications  . metroNIDAZOLE (FLAGYL) 500 MG tablet    Sig: Take 1 tablet (500 mg total) by mouth 2 (two) times daily.    Dispense:  14 tablet    Refill:  0    I personally performed the services described in this documentation, which was scribed in my presence. The recorded information has been  reviewed and considered, and addended by me as needed.  Delman Cheadle, MD MPH  Results for orders placed or performed in visit on 10/25/14  Hepatic Function Panel  Result Value Ref Range   Total Bilirubin 0.5 0.2 - 1.2 mg/dL   Bilirubin, Direct 0.1 0.0 - 0.3 mg/dL   Indirect Bilirubin 0.4 0.2 - 1.2 mg/dL   Alkaline Phosphatase 72 39 - 117 U/L   AST 18 0 - 37 U/L   ALT 32 0 - 35 U/L   Total Protein 6.2 6.0 - 8.3 g/dL   Albumin 3.6 3.5 - 5.2 g/dL  Hepatitis B surface antigen  Result Value Ref  Range   Hepatitis B Surface Ag NEGATIVE NEGATIVE  Hepatitis B surface antibody  Result Value Ref Range   Hepatitis B-Post 554.0 mIU/mL  Hepatitis A Antibody, IGM  Result Value Ref Range   Hep A IgM NON REACTIVE NON REACTIVE  Hepatitis A Antibody, Total  Result Value Ref Range   Hep A Total Ab REACTIVE (A) NON REACTIVE  Hepatitis B core antibody, total  Result Value Ref Range   Hep B Core Total Ab NON REACTIVE NON REACTIVE  Gamma GT  Result Value Ref Range   GGT 91 (H) 7 - 51 U/L  Lipase  Result Value Ref Range   Lipase 13 0 - 75 U/L  CK  Result Value Ref Range   Total CK 245 (H) 7 - 177 U/L  ANA  Result Value Ref Range   Anit Nuclear Antibody(ANA) NEG NEGATIVE  Sedimentation Rate  Result Value Ref Range   Sed Rate 5 0 - 20 mm/hr  C-reactive protein  Result Value Ref Range   CRP <0.5 <0.60 mg/dL

## 2014-10-25 NOTE — Patient Instructions (Signed)
Bacterial Vaginosis Bacterial vaginosis is a vaginal infection that occurs when the normal balance of bacteria in the vagina is disrupted. It results from an overgrowth of certain bacteria. This is the most common vaginal infection in women of childbearing age. Treatment is important to prevent complications, especially in pregnant women, as it can cause a premature delivery. CAUSES  Bacterial vaginosis is caused by an increase in harmful bacteria that are normally present in smaller amounts in the vagina. Several different kinds of bacteria can cause bacterial vaginosis. However, the reason that the condition develops is not fully understood. RISK FACTORS Certain activities or behaviors can put you at an increased risk of developing bacterial vaginosis, including:  Having a new sex partner or multiple sex partners.  Douching.  Using an intrauterine device (IUD) for contraception. Women do not get bacterial vaginosis from toilet seats, bedding, swimming pools, or contact with objects around them. SIGNS AND SYMPTOMS  Some women with bacterial vaginosis have no signs or symptoms. Common symptoms include:  Grey vaginal discharge.  A fishlike odor with discharge, especially after sexual intercourse.  Itching or burning of the vagina and vulva.  Burning or pain with urination. DIAGNOSIS  Your health care provider will take a medical history and examine the vagina for signs of bacterial vaginosis. A sample of vaginal fluid may be taken. Your health care provider will look at this sample under a microscope to check for bacteria and abnormal cells. A vaginal pH test may also be done.  TREATMENT  Bacterial vaginosis may be treated with antibiotic medicines. These may be given in the form of a pill or a vaginal cream. A second round of antibiotics may be prescribed if the condition comes back after treatment.  HOME CARE INSTRUCTIONS   Only take over-the-counter or prescription medicines as  directed by your health care provider.  If antibiotic medicine was prescribed, take it as directed. Make sure you finish it even if you start to feel better.  Do not have sex until treatment is completed.  Tell all sexual partners that you have a vaginal infection. They should see their health care provider and be treated if they have problems, such as a mild rash or itching.  Practice safe sex by using condoms and only having one sex partner. SEEK MEDICAL CARE IF:   Your symptoms are not improving after 3 days of treatment.  You have increased discharge or pain.  You have a fever. MAKE SURE YOU:   Understand these instructions.  Will watch your condition.  Will get help right away if you are not doing well or get worse. FOR MORE INFORMATION  Centers for Disease Control and Prevention, Division of STD Prevention: [employer] American Sexual Health Association (ASHA): www.ashastd.org  Document Released: 04/08/2005 Document Revised: 01/27/2013 Document Reviewed: 11/18/2012 [facility] Patient Information 2015 [place], [place]. This information is not intended to replace advice given to you by your health care provider. Make sure you discuss any questions you have with your health care provider.  Liver Profile A liver profile is a battery of tests which helps your caregiver evaluate your liver function. The following tests are often included in the liver profile: Alanine aminotransferase (ALT or SGPT) This is an enzyme found primarily in the liver. Abnormalities may represent liver disease. This is found in cells of the liver so when it is elevated, it has been released by damaged cells. Albumin - The serum albumin is one of the major proteins in the blood and a reflection  of the general state of nutrition. This is low when the liver is unable to do its job. It is also low when protein is lost in the urine. NORMAL FINDINGS Adult/Elderly  Total protein: 6.4-8.3 g/dL or 64-83 g/L (SI  units)  Albumin: 3.5-5 g/dL or 35-50 g/L (SI units)  Globulin: 2.3-3.4 g/dL  Alpha1 globulin: 0.1-0.3 g/dL or 1-3 g/L (SI units)  Alpha2 globulin: 0.6-1 g/dL or 6-10 g/L (SI units)  Beta globulin: 0.7-1.1 g/dL or 7-11 g/L (SI units) Children  Total protein  Premature infant: 4.2-7.6 g/dL  Newborn: 4.6-7.4 g/dL  Infant: 6-6.7 g/dL  Child: 6.2-8 g/dL  Albumin  Premature infant: 3-4.2 g/dL  Newborn: 3.5-5.4 g/dL  Infant: 4.4-5.4 g/dL  Child: 4-5.9 g/dL Albumin/Globulin ratio - Calculated by dividing the albumin by the globulin. It is a measure of well being.  Alkaline phosphatase - This is an enzyme which is important in diagnosing proper bone and liver functions. NORMAL FINDINGS Age / Normal Value (units/L)  0-5 days / 35-140  Less than 3 yr / 15-60  3-6 yr / 15-50  6-12 yr / 10-50  12-18 yr / 10-40  Adult / 0-35 units/L or 0-0.58 microKat/L (SI Units) (Females tend to have slightly lower levels than males)  Elderly / Slightly higher than adults Aspartate aminotransferase (AST or SGOT) - an enzyme found in skeletal and heart muscle, liver and other organs. Abnormalities may represent liver disease. This is found in cells of the liver so when it is elevated, it has been released by damaged cells. Bilirubin, Total: A chemical involved with liver functions. High concentrations may result in jaundice. Jaundice is a yellowing of the skin and the whites of the eyes. NORMAL FINDINGS Blood  Adult/elderly/child  Total bilirubin: 0.3-1.0 mg/dL or 5.1-17 micromole/L (SI units)  Indirect bilirubin: 0.2-0.8 mg/dL or 3.4-12.0 micromole/L (SI units)  Direct bilirubin: 0.1-0.3 mg/dL or 1.7-5.1 micromole/L (SI units)  Newborn total bilirubin: 1.0-12.0 mg/dL or 17.1-205 micromole/L (SI units)  Urine0-0.02 mg/dL Ranges for normal findings may vary among different laboratories and hospitals. You should always check with your doctor after having lab work or other tests  done to discuss the meaning of your test results and whether your values are considered within normal limits PREPARATION FOR TEST No preparation or fasting is necessary unless you have been informed otherwise. A blood sample is obtained by inserting a needle into a vein in the arm. MEANING OF TEST  Your caregiver will go over the test results with you and discuss the importance and meaning of your results, as well as treatment options and the need for additional tests if necessary. OBTAINING THE TEST RESULTS It is your responsibility to obtain your test results. Ask the lab or department performing the test when and how you will get your results. Document Released: 05/11/2004 Document Revised: 07/01/2011 Document Reviewed: 08/10/2013 [facility] Patient Information 2015 [place], [place]. This information is not intended to replace advice given to you by your health care provider. Make sure you discuss any questions you have with your health care provider.  Liver Disease Diet The liver disease diet offers guidelines regarding what foods to eat while affected by a liver disease. The liver is the largest organ in the body and is involved in many important bodily functions. Some of the liver's functions are to remove harmful substances from the bloodstream and to make sure they can exit the body. The liver also produces fluids used by the body and helps use and store energy from  food. Liver disease affects the functioning of the liver and the way your body uses energy from foods. An important part of the liver disease diet is to get the right amount of calories from a variety of foods. GUIDELINES Sodium Sodium is a mineral that helps the body change the amount of water and fluids it holds. Too much sodium can cause the body to hold too much fluid. You may need to decrease the amount of sodium in your diet if your body is collecting fluid in your stomach or legs. To decrease the amount of sodium in your diet,  do not add extra salt to foods. Also, limit or avoid foods that contain lots of sodium, such as:  Salted snacks (pretzels, potato chips, crackers).  Canned foods (vegetables, soups, juice).  Salted or cured meats and deli meats.  Condiments (ketchup, mustard, soy sauce, barbecue sauce).  Sauerkraut and pickles.  Frozen dinners or processed or preserved foods. A diet of less than 2000 mg of sodium may be recommended. Check food labels for specific levels of sodium.  Alcohol Drinking alcohol may harm your liver. Avoid or limit drinks containing alcohol, such as beer, wine, and hard liquor.  Calories It is important to make sure you are getting enough calories in your diet so that your body gets enough energy and stays at a healthy weight. Include a variety of foods in your diet. Carbohydrates Carbohydrates are found in foods such as breads and starches, grains (oats, flour), cereals, and some vegetables (corn, peas). Carbohydrates change the level of sugar (glucose) in the blood. Advanced liver disease can affect how much glucose is in the blood, making the glucose level too high or too low. Eating carbohydrates in the right amount will help control your blood glucose. A registered dietician can help you determine how much carbohydrate you need each day. Protein Eating the right amount of protein every day is important for liver disease. Protein is found in foods such as meat, poultry (chicken, [place]), fish, milk, eggs, yogurt, peanuts, peanut butter, and beans. Include a protein-containing food at each meal. Document Released: 04/08/2005 Document Revised: 07/01/2011 Document Reviewed: 02/28/2011 [facility] Patient Information 2015 [place], [place]. This information is not intended to replace advice given to you by your health care provider. Make sure you discuss any questions you have with your health care provider.  Alanine Aminotransferase Alanine aminotransferase (ALT) is an enzyme that is  found mainly in the liver but also in the kidney, heart, and skeletal muscle. Under normal conditions, ALT levels in the blood are low. Injury to any of these organs can produce elevations. Damage to the liver is the main cause of an abnormality of this enzyme, but it can be caused by damage to any of the organ systems listed above. ALT is also tested in long-standing liver disease. ALT may be used to monitor the treatment of people who have liver disease or to see if the treatment is working. A large number of medicines may also cause elevations. ALT may be ordered either by itself or along with other tests. PREPARATION FOR TEST No preparation or fasting is required. A blood sample is taken by a needle from a vein.  If you are female, avoid strenuous exercise before the test.  Tell the person doing the test if you are a smoker. NORMAL FINDINGS   Adult or child: 0 to 35 units/L (0 to 0.58 microkat/L)  Elderly adult may be slightly higher  Infant: may be twice as high  as an adult  Values may be higher in men and African Americans. Ranges for normal findings may vary among different laboratories and hospitals. You should always check with your caregiver after having lab work or other tests done to discuss the meaning of your test results and whether your values are considered within normal limits. MEANING OF TEST  Your caregiver will go over the test results with you and discuss the importance and meaning of your results, as well as treatment options and the need for additional tests if necessary. OBTAINING THE TEST RESULTS  It is your responsibility to obtain your test results. Ask the lab or department performing the test when and how you will get your results. Document Released: 04/30/2004 Document Revised: 07/01/2011 Document Reviewed: 03/13/2008 [facility] Patient Information 2015 [place], [place]. This information is not intended to replace advice given to you by your health care provider. Make  sure you discuss any questions you have with your health care provider.  Aspartate Aminotransferase (AST) This is a test to identify liver damage. AST is an enzyme found mostly in the heart and liver. AST is found to a lesser extent in other muscles. When liver or muscle cells are injured, AST is released into the blood. This test is usually done when there are signs of liver problems. Even though AST is found in heart and other muscles, another enzyme, creatine kinase (CK), is present in much higher amounts. CK is usually used to detect heart or muscle injury. PREPARATION FOR TEST A sample is taken by needle from a vein in the arm. NORMAL FINDINGS  Age: 58 to 5 days  Normal value limits: 35 to 140 units/L  Age: less than 3 years  Normal value limits: 15 to 60 units/L  Age: 15 to 6 years  Normal value limits: 15 to 50 units/L  Age: 36 to 12 years  Normal value limits: 10 to 50 units/L  Age: 59 to 18 years  Normal value limits: 10 to 40 units/L  Age: Adult  Normal value limits: 0 to 35 units/L (0 to 0.58 microKat/L) Females tend to have slightly lower levels than males. The elderly tend to have slightly higher levels than the "normal" adult range. Ranges for normal findings may vary among different laboratories and hospitals. You should always check with your caregiver after having lab work or other tests done to discuss the meaning of your test results and whether your values are considered within normal limits. MEANING OF TEST  Your caregiver will go over the test results with you. Your caregiver will discuss the importance and meaning of your results. He or she will also discuss treatment options and additional tests, if needed. OBTAINING THE TEST RESULTS  It is your responsibility to obtain your test results. Ask the lab or department performing the test when and how you will get your results. Document Released: 05/01/2004 Document Revised: 07/01/2011 Document Reviewed:  03/12/2008 [facility] Patient Information 2015 [place], [place]. This information is not intended to replace advice given to you by your health care provider. Make sure you discuss any questions you have with your health care provider.

## 2014-10-26 LAB — HEPATITIS B SURFACE ANTIGEN: HEP B S AG: NEGATIVE

## 2014-10-26 LAB — HEPATITIS A ANTIBODY, TOTAL: HEP A TOTAL AB: REACTIVE — AB

## 2014-10-26 LAB — ANA: ANA: NEGATIVE

## 2014-10-26 LAB — SEDIMENTATION RATE: SED RATE: 5 mm/h (ref 0–20)

## 2014-10-26 LAB — HEPATITIS B CORE ANTIBODY, TOTAL: Hep B Core Total Ab: NONREACTIVE

## 2014-10-26 LAB — HEPATITIS A ANTIBODY, IGM: HEP A IGM: NONREACTIVE

## 2014-10-26 LAB — HEPATITIS B SURFACE ANTIBODY, QUANTITATIVE: HEPATITIS B-POST: 554 m[IU]/mL

## 2014-10-26 NOTE — Telephone Encounter (Signed)
Error

## 2014-10-27 LAB — HEPATITIS C RNA QUANTITATIVE: HCV QUANT: NOT DETECTED [IU]/mL (ref <15)

## 2014-10-27 LAB — ALDOLASE: Aldolase: 5.3 U/L (ref <=8.1)

## 2014-10-28 ENCOUNTER — Other Ambulatory Visit: Payer: Self-pay | Admitting: Family Medicine

## 2014-10-28 ENCOUNTER — Ambulatory Visit
Admission: RE | Admit: 2014-10-28 | Discharge: 2014-10-28 | Disposition: A | Discharge status: Home / Self Care | Payer: Self-pay | Source: Ambulatory Visit | Attending: Family Medicine | Admitting: Family Medicine

## 2014-10-28 DIAGNOSIS — R7401 Elevation of levels of liver transaminase levels: Secondary | ICD-10-CM

## 2014-10-28 DIAGNOSIS — R740 Nonspecific elevation of levels of transaminase and lactic acid dehydrogenase [LDH]: Principal | ICD-10-CM

## 2014-11-04 ENCOUNTER — Ambulatory Visit: Payer: BLUE CROSS/BLUE SHIELD | Admitting: Physician Assistant

## 2014-11-21 ENCOUNTER — Ambulatory Visit (INDEPENDENT_AMBULATORY_CARE_PROVIDER_SITE_OTHER): Payer: BLUE CROSS/BLUE SHIELD | Admitting: Physician Assistant

## 2014-11-21 ENCOUNTER — Encounter: Payer: Self-pay | Admitting: Physician Assistant

## 2014-11-21 ENCOUNTER — Other Ambulatory Visit (INDEPENDENT_AMBULATORY_CARE_PROVIDER_SITE_OTHER): Payer: BLUE CROSS/BLUE SHIELD

## 2014-11-21 VITALS — BP 110/80 | HR 62 | Ht 64.0 in | Wt 208.2 lb

## 2014-11-21 DIAGNOSIS — R7989 Other specified abnormal findings of blood chemistry: Secondary | ICD-10-CM

## 2014-11-21 DIAGNOSIS — R945 Abnormal results of liver function studies: Secondary | ICD-10-CM

## 2014-11-21 LAB — HEPATIC FUNCTION PANEL
ALBUMIN: 3.9 g/dL (ref 3.5–5.2)
ALT: 26 U/L (ref 0–35)
AST: 20 U/L (ref 0–37)
Alkaline Phosphatase: 77 U/L (ref 39–117)
BILIRUBIN DIRECT: 0.2 mg/dL (ref 0.0–0.3)
Total Bilirubin: 0.5 mg/dL (ref 0.2–1.2)
Total Protein: 6.8 g/dL (ref 6.0–8.3)

## 2014-11-21 LAB — CBC WITH DIFFERENTIAL/PLATELET
Basophils Absolute: 0 10*3/uL (ref 0.0–0.1)
Basophils Relative: 0.9 % (ref 0.0–3.0)
EOS ABS: 0.2 10*3/uL (ref 0.0–0.7)
Eosinophils Relative: 3.6 % (ref 0.0–5.0)
HEMATOCRIT: 39.6 % (ref 36.0–46.0)
Hemoglobin: 13.3 g/dL (ref 12.0–15.0)
Lymphocytes Relative: 50.9 % — ABNORMAL HIGH (ref 12.0–46.0)
Lymphs Abs: 2.5 10*3/uL (ref 0.7–4.0)
MCHC: 33.7 g/dL (ref 30.0–36.0)
MCV: 87.5 fl (ref 78.0–100.0)
MONOS PCT: 7.6 % (ref 3.0–12.0)
Monocytes Absolute: 0.4 10*3/uL (ref 0.1–1.0)
Neutro Abs: 1.8 10*3/uL (ref 1.4–7.7)
Neutrophils Relative %: 37 % — ABNORMAL LOW (ref 43.0–77.0)
PLATELETS: 297 10*3/uL (ref 150.0–400.0)
RBC: 4.52 Mil/uL (ref 3.87–5.11)
RDW: 13.4 % (ref 11.5–15.5)
WBC: 5 10*3/uL (ref 4.0–10.5)

## 2014-11-21 LAB — LIPASE: Lipase: 16 U/L (ref 11.0–59.0)

## 2014-11-21 LAB — IGA: IgA: 96 mg/dL (ref 68–378)

## 2014-11-21 LAB — AMYLASE: Amylase: 30 U/L (ref 27–131)

## 2014-11-21 LAB — PROTIME-INR
INR: 1 ratio (ref 0.8–1.0)
Prothrombin Time: 10.9 s (ref 9.6–13.1)

## 2014-11-21 NOTE — Progress Notes (Signed)
Reviewed and agree with management plan.  Haruto Demaria T. Vicie Cech, MD FACG 

## 2014-11-21 NOTE — Progress Notes (Signed)
Patient ID: KINZE LABO, female   DOB: 01/15/1966, 49 y.o.   MRN: [id number]    HPI:  Megan Gardner is a 49 y.o.   female  referred by Willey Blade, MD for evaluation of abnormal LFTs.    Megan Gardner is a 49 year old African-American female who is employed as a Recruitment consultant. She was recently evaluated an urgent medical and family care [facility] routine physical to establish care with a provider. At that time, she was sent for routine blood work and was found to have an elevation of her transaminases. She was then sent for an ultrasound that revealed numerous shadowing gallstones measuring up to 2 cm in diameter with normal gallbladder wall thickness and no ductal dilatation. Suki denies abdominal pain, nausea, or vomiting. 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 She has no history of blood transfusions , an denies use of intravenous drugs.  She drinks 1 glass of wine per week. She denies use of herbal supplements. She has not been on any new medications.   Past Medical History  Diagnosis Date  . Asthma   . Fibroids   . Vitamin D deficiency   . Back pain   . Carpal tunnel syndrome     left wrist    Past Surgical History  Procedure Laterality Date  . Myomectomy     History reviewed. No pertinent family history. History  Substance Use Topics  . Smoking status: Never Smoker   . Smokeless tobacco: Not on file  . Alcohol Use: 0.6 oz/week    0 Standard drinks or equivalent, 1 Glasses of wine per week   Current Outpatient Prescriptions  Medication Sig Dispense Refill  . cyclobenzaprine (FLEXERIL) 10 MG tablet Take 1 tablet (10 mg total) by mouth at bedtime. 30 tablet 1  . ergocalciferol (VITAMIN D2) 50000 UNITS capsule Take 1 capsule (50,000 Units total) by mouth once a week. 12 capsule 1  . meloxicam (MOBIC) 15 MG tablet Take 1 tablet (15 mg total) by mouth daily. Do not use with any other otc pain  medication other than acetaminophen 30 tablet 1   No current facility-administered medications for this visit.   No Known Allergies   Review of Systems: Gen: Denies any fever, chills, sweats, anorexia, fatigue, weakness, malaise, weight loss, and sleep disorder CV: Denies chest pain, angina, palpitations, syncope, orthopnea, PND, peripheral edema, and claudication. Resp: Denies dyspnea at rest, dyspnea with exercise, cough, sputum, wheezing, coughing up blood, and pleurisy. GI: Denies vomiting blood, jaundice, and fecal incontinence.   Denies dysphagia or odynophagia. GU : Denies urinary burning, blood in urine, urinary frequency, urinary hesitancy, nocturnal urination, and urinary incontinence. MS:  Has intermittent right-sided low back pain that is worse with movement. Derm: Denies rash, itching, dry skin, hives, moles, warts, or unhealing ulcers.  Psych: Denies depression, anxiety, memory loss, suicidal ideation, hallucinations, paranoia, and confusion. Heme: Denies bruising, bleeding, and enlarged lymph nodes. Neuro:  Denies any headaches, dizziness, paresthesias. Endo:  Denies any problems with DM, thyroid, adrenal function  Studies: US Abdomen Complete  10/28/2014   CLINICAL DATA:  49 year old female with transaminitis. Initial encounter.  EXAM: ULTRASOUND ABDOMEN COMPLETE  COMPARISON:  Pelvis MRI 11/08/2004 and pelvis ultrasound 09/12/2003.  FINDINGS: Gallbladder: Numerous shadowing gallstones. Stones individually measure up to 2 cm diameter. Gallbladder wall thickness remains normal at 1 to 2 mm. No pericholecystic fluid. No sonographic Murphy sign elicited.  Common  bile duct: Diameter: 5 mm, normal  Liver: No focal lesion identified. Within normal limits in parenchymal echogenicity.  IVC: No abnormality visualized.  Pancreas: Visualized portion unremarkable.  Spleen: Size and appearance within normal limits.  Right Kidney: Length: 11.0 cm. Echogenicity within normal limits. No mass or  hydronephrosis visualized.  Left Kidney: Length: 10.0 cm. Midpole cyst with a single thin septation measuring 3.8 cm. No suspicious vascularity on brief Doppler evaluation. Echogenicity within normal limits. No mass or hydronephrosis visualized.  Abdominal aorta: No aneurysm visualized.  Other findings: None.  IMPRESSION: 1. Cholelithiasis. No evidence of acute cholecystitis or biliary obstruction. 2. Normal sonographic appearance of the liver. 3. Bosniak category 1 left renal cyst, no follow-up needed.   Electronically Signed   By: Genevie Ann M.D.   On: 10/28/2014 12:23    LAB RESULTS:  blood work on 10/14/2014  Showed total bili 1.3, alkaline phosphatase 119, AST 685, ALT 613.   CBC 10/14/2014 white count 4.7, hemoglobin 13.5, hematocrit 38.7, platelets 251,000.   blood work July 15,016 hepatitis A total antibody reactive, hepatitis B surface antigen 10 negative. Hepatitis B core total antibody nonreactive. Hepatitis B antibody +554 indicating immunity.   HCV quantitative not detected.  CRP less than  0.5   chem profile on 10/25/2014 lipase 13, AST 18, ALT 32, total bili 0.5. Alkaline phosphatase 72.  Physical Exam: BP 110/80 mmHg  Pulse 62  Ht 5\' 4"  (1.626 m)  Wt 208 lb 4 oz (94.462 kg)  BMI 35.73 kg/m2  LMP 10/16/2014 (Exact Date) Constitutional: Pleasant,well-developed,  African-Americanfemale in no acute distress. HEENT: Normocephalic and atraumatic. Conjunctivae are normal. No scleral icterus. Neck supple.  No cervical adenopathy Cardiovascular: Normal rate, regular rhythm.  Pulmonary/chest: Effort normal and breath sounds normal. No wheezing, rales or rhonchi. Abdominal: Soft, nondistended, nontender. Bowel sounds active throughout. There are no masses palpable. No hepatomegaly. Extremities: no edema Lymphadenopathy: No cervical adenopathy noted. Neurological: Alert and oriented to person place and time. Skin: Skin is warm and dry. No rashes noted. Psychiatric: Normal mood and  affect. Behavior is normal.  ASSESSMENT AND PLAN:  Asymptomatic 49 year old female  recently found to have an elevation of her transaminases and alkaline phosphatase , referred for evaluation. Patient is currently asymptomatic and her hepatic function tests have returned to normal. Viral serologies have been negative. She has been noted to have gallstones and may have passed a stone. We will repeat a hepatic function panel and lipase and obtain a PT-INR. If she is again noted to have abnormal LFTs , further evaluation possible autoimmune etiology , Wilson's disease , AFP etc. will be obtained.    Nala Kachel, Vita Barley PA-C 11/21/2014, 12:03 PM  CC: Willey Blade, MD

## 2014-11-21 NOTE — Patient Instructions (Signed)
Your physician has requested that you go to the basement for  lab work before leaving today.   

## 2014-11-22 LAB — TISSUE TRANSGLUTAMINASE, IGA: TISSUE TRANSGLUTAMINASE AB, IGA: 1 U/mL (ref <4)

## 2015-02-09 ENCOUNTER — Telehealth: Payer: Self-pay

## 2015-02-09 ENCOUNTER — Encounter: Payer: Self-pay | Admitting: Family Medicine

## 2015-02-09 NOTE — Progress Notes (Deleted)
Subjective:    Patient ID: Megan Gardner, female    DOB: 1965-06-13, 49 y.o.   MRN: [id number]    HPI  HPI Comments: [name] is a 49 y.o. female with a hx of asthma who presents to the [facility] for a follow-up.  Pt was initially seen 4 mos prior for a routine full physical and was found to have a AST was 685 and ALT 613.  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 When LFTs were recheck 2 weeks later, they had resolved to normal. Pt was noted to have numerous gallstones on [place] and was seen by GI. It is suspected that perhaps she had just passed a gallstone and to continue with watchful waiting.  If it recurs, would need autoimmune w/u. Pt was asytmptomatic as far as cholelithiasis sxs but we may want to consider cholecystectomy due to large number of gallstones so concern for recurrent liver irritation and certainly always has the poss to develop cholecystitis.    Of note, pt was able to cease alcohol intake w/o a problem.  No personal or family history of liver problems   At her complete physical 4 mos ago, she was found to have vitamin D deficiency with vitamin D of 19, started on high-dose replacement.    Normal A1c, TSH, RPR, and GC chlamydia. Negative hepatitis C and HIV. Normal UA and CBC. Normal liver function. Has been vaccinated against hepatitis A or B. Fasting lipid panel that was normal. Triglycerides were 27.   Uses Aleve for aches and pains. Was having some right lower back pain that seemed MSK in nature.  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    Review of Systems  Constitutional: Negative for fever, chills, activity change, appetite change and  unexpected weight change.  Gastrointestinal: Negative.  Negative for nausea, vomiting, abdominal pain, diarrhea, constipation and abdominal distention.  Genitourinary: Negative.  Negative for urgency, decreased urine volume and pelvic pain.  Musculoskeletal: Positive for back pain.  Skin: Negative for color change.  Hematological: Negative for adenopathy. Does not bruise/bleed easily.  Psychiatric/Behavioral: Negative for self-injury and dysphoric mood.       Objective:    Physical Exam  Constitutional: She is oriented to person, place, and time. She appears well-developed and well-nourished. No distress.  HENT:  Head: Normocephalic and atraumatic.  Mouth/Throat: Oropharynx is clear and moist. No oropharyngeal exudate.  Eyes: Pupils are equal, round, and reactive to light.  Neck: Neck supple.  Cardiovascular: Normal rate.   Pulmonary/Chest: Effort normal.  Musculoskeletal: She exhibits no edema.  Neurological: She is alert and oriented to person, place, and time. No cranial nerve deficit.  Skin: Skin is warm and dry. No rash noted.  Poorly defined subcutaneous mass approximately 1 cm at left upper quadrant near midline, palpable on exam. Mobile and non-tender. No fluctuance or induration. Patient had not noticed prior.  Psychiatric: She has a normal mood and affect. Her behavior is normal.  Vitals reviewed.         Assessment & Plan:   1. Transaminitis - unknown etiology - occ EtOH and tylenol but in no significant  amount per pt.  Has stopped both completely for the past wk so recheck lfts today along with viral hep panel.  [place] P.  2. Vitamin D deficiency       cmp   Delman Cheadle, MD MPH  Pt had CPE on 10/14/2014

## 2015-02-09 NOTE — Telephone Encounter (Signed)
LMVM pt missed appt w Dr. Brigitte Pulse today, please CB to reschedule.

## 2015-02-10 ENCOUNTER — Ambulatory Visit: Payer: Self-pay | Admitting: Family Medicine

## 2015-03-22 NOTE — Progress Notes (Signed)
This encounter was created in error - please disregard.

## 2015-04-13 ENCOUNTER — Encounter: Payer: Self-pay | Admitting: Family Medicine

## 2015-04-13 ENCOUNTER — Ambulatory Visit (INDEPENDENT_AMBULATORY_CARE_PROVIDER_SITE_OTHER): Payer: BLUE CROSS/BLUE SHIELD | Admitting: Family Medicine

## 2015-04-13 VITALS — BP 119/81 | HR 103 | Temp 97.2°F | Resp 16 | Ht 64.5 in | Wt 216.8 lb

## 2015-04-13 DIAGNOSIS — E559 Vitamin D deficiency, unspecified: Secondary | ICD-10-CM | POA: Diagnosis not present

## 2015-04-13 DIAGNOSIS — R740 Nonspecific elevation of levels of transaminase and lactic acid dehydrogenase [LDH]: Secondary | ICD-10-CM | POA: Diagnosis not present

## 2015-04-13 DIAGNOSIS — R7401 Elevation of levels of liver transaminase levels: Secondary | ICD-10-CM

## 2015-04-13 LAB — COMPREHENSIVE METABOLIC PANEL
ALT: 18 U/L (ref 6–29)
AST: 17 U/L (ref 10–35)
Albumin: 3.9 g/dL (ref 3.6–5.1)
Alkaline Phosphatase: 62 U/L (ref 33–115)
BUN: 16 mg/dL (ref 7–25)
CHLORIDE: 105 mmol/L (ref 98–110)
CO2: 26 mmol/L (ref 20–31)
CREATININE: 0.98 mg/dL (ref 0.50–1.10)
Calcium: 9 mg/dL (ref 8.6–10.2)
Glucose, Bld: 101 mg/dL — ABNORMAL HIGH (ref 65–99)
POTASSIUM: 3.9 mmol/L (ref 3.5–5.3)
SODIUM: 138 mmol/L (ref 135–146)
Total Bilirubin: 0.3 mg/dL (ref 0.2–1.2)
Total Protein: 6.6 g/dL (ref 6.1–8.1)

## 2015-04-14 LAB — VITAMIN D 25 HYDROXY (VIT D DEFICIENCY, FRACTURES): VIT D 25 HYDROXY: 11 ng/mL — AB (ref 30–100)

## 2015-04-22 NOTE — Progress Notes (Signed)
Subjective:    Patient ID: Megan Gardner, female    DOB: [birth year], 49 y.o.   MRN: [id number] Chief Complaint  Patient presents with  . Follow-up    vitamin d level    HPI  Doing great. Getting exercise through football. Drinking minimal alcohol. Wearing wrist brace at night as carpal tunnel syndrome still bothers her some but not taking any otc nsaids/pain meds.  Is only taking a mvi qd.  Past Medical History  Diagnosis Date  . Asthma   . Fibroids   . Vitamin D deficiency   . Back pain   . Carpal tunnel syndrome     left wrist   Past Surgical History  Procedure Laterality Date  . Myomectomy     Current Outpatient Prescriptions on File Prior to Visit  Medication Sig Dispense Refill  . cyclobenzaprine (FLEXERIL) 10 MG tablet Take 1 tablet (10 mg total) by mouth at bedtime. (Patient not taking: Reported on 04/13/2015) 30 tablet 1  . ergocalciferol (VITAMIN D2) 50000 UNITS capsule Take 1 capsule (50,000 Units total) by mouth once a week. (Patient not taking: Reported on 04/13/2015) 12 capsule 1  . meloxicam (MOBIC) 15 MG tablet Take 1 tablet (15 mg total) by mouth daily. Do not use with any other otc pain medication other than acetaminophen (Patient not taking: Reported on 04/13/2015) 30 tablet 1   No current facility-administered medications on file prior to visit.   No Known Allergies No family history on file. Social History   Social History  . Marital Status: Single    Spouse Name: N/A  . Number of Children: 0  . Years of Education: N/A   Occupational History  . bus driver    Social History Main Topics  . Smoking status: Never Smoker   . Smokeless tobacco: None  . Alcohol Use: 0.6 oz/week    0 Standard drinks or equivalent, 1 Glasses of wine per week  . Drug Use: No  . Sexual Activity: Not Asked   Other Topics Concern  . None   Social History Narrative     Review of Systems  Constitutional: Positive for fatigue. Negative for fever, chills,  diaphoresis and appetite change.  Eyes: Negative for visual disturbance.  Respiratory: Negative for cough and shortness of breath.   Cardiovascular: Negative for chest pain, palpitations and leg swelling.  Gastrointestinal: Negative for nausea, vomiting, abdominal pain, diarrhea, constipation, blood in stool, abdominal distention and anal bleeding.  Genitourinary: Negative for decreased urine volume.  Musculoskeletal: Positive for myalgias and arthralgias.  Neurological: Negative for syncope and headaches.  Hematological: Does not bruise/bleed easily.  Psychiatric/Behavioral: Positive for sleep disturbance.       Objective:  BP 134/91 mmHg  Pulse 103  Temp(Src) 97.2 F (36.2 C) (Oral)  Resp 16  Ht 5' 4.5" (1.638 m)  Wt 216 lb 12.8 oz (98.34 kg)  BMI 36.65 kg/m2  SpO2 97%  LMP 03/30/2015  Recheck bp 119/81  Physical Exam  Constitutional: She is oriented to person, place, and time. She appears well-developed and well-nourished. No distress.  HENT:  Head: Normocephalic and atraumatic.  Right Ear: External ear normal.  Left Ear: External ear normal.  Eyes: Conjunctivae are normal. No scleral icterus.  Neck: Normal range of motion. Neck supple. No thyromegaly present.  Cardiovascular: Normal rate, regular rhythm, normal heart sounds and intact distal pulses.   Pulmonary/Chest: Effort normal and breath sounds normal. No respiratory distress.  Abdominal: Normal appearance and bowel sounds are normal. There is  no hepatosplenomegaly. There is no tenderness. There is no CVA tenderness.  Musculoskeletal: She exhibits no edema.  Lymphadenopathy:    She has no cervical adenopathy.  Neurological: She is alert and oriented to person, place, and time.  Skin: Skin is warm and dry. She is not diaphoretic. No erythema.  Psychiatric: She has a normal mood and affect. Her behavior is normal.          Assessment & Plan:   1. Vitamin D deficiency   2. Transaminitis   Doing  great.  Orders Placed This Encounter  Procedures  . VITAMIN D 25 Hydroxy (Vit-D Deficiency, Fractures)  . Comprehensive metabolic panel     Delman Cheadle, MD MPH

## 2015-05-02 ENCOUNTER — Encounter: Payer: Self-pay | Admitting: Family Medicine

## 2015-05-02 MED ORDER — ERGOCALCIFEROL 1.25 MG (50000 UT) PO CAPS: 50000.0000 [IU] | ORAL_CAPSULE | ORAL | Status: DC

## 2015-05-02 NOTE — Addendum Note (Signed)
Addended by: Delman Cheadle on: 05/02/2015 02:44 PM   Modules accepted: Orders, Medications

## 2015-07-21 ENCOUNTER — Encounter (HOSPITAL_COMMUNITY): Payer: Self-pay | Admitting: Emergency Medicine

## 2015-07-21 ENCOUNTER — Emergency Department (HOSPITAL_COMMUNITY)
Admission: EM | Admit: 2015-07-21 | Discharge: 2015-07-21 | Disposition: A | Discharge status: Home / Self Care | Payer: BLUE CROSS/BLUE SHIELD | Attending: Emergency Medicine | Admitting: Emergency Medicine

## 2015-07-21 ENCOUNTER — Emergency Department (HOSPITAL_COMMUNITY): Payer: BLUE CROSS/BLUE SHIELD

## 2015-07-21 DIAGNOSIS — X501XXA Overexertion from prolonged static or awkward postures, initial encounter: Secondary | ICD-10-CM | POA: Insufficient documentation

## 2015-07-21 DIAGNOSIS — Z8669 Personal history of other diseases of the nervous system and sense organs: Secondary | ICD-10-CM | POA: Diagnosis not present

## 2015-07-21 DIAGNOSIS — Y9361 Activity, american tackle football: Secondary | ICD-10-CM | POA: Insufficient documentation

## 2015-07-21 DIAGNOSIS — Y998 Other external cause status: Secondary | ICD-10-CM | POA: Diagnosis not present

## 2015-07-21 DIAGNOSIS — J45909 Unspecified asthma, uncomplicated: Secondary | ICD-10-CM | POA: Insufficient documentation

## 2015-07-21 DIAGNOSIS — Y92321 Football field as the place of occurrence of the external cause: Secondary | ICD-10-CM | POA: Diagnosis not present

## 2015-07-21 DIAGNOSIS — Z86018 Personal history of other benign neoplasm: Secondary | ICD-10-CM | POA: Insufficient documentation

## 2015-07-21 DIAGNOSIS — S8991XA Unspecified injury of right lower leg, initial encounter: Secondary | ICD-10-CM

## 2015-07-21 DIAGNOSIS — E559 Vitamin D deficiency, unspecified: Secondary | ICD-10-CM | POA: Insufficient documentation

## 2015-07-21 DIAGNOSIS — M25561 Pain in right knee: Secondary | ICD-10-CM

## 2015-07-21 MED ORDER — OXYCODONE-ACETAMINOPHEN 5-325 MG PO TABS
1.0000 | ORAL_TABLET | Freq: Once | ORAL | Status: AC
  Administered 2015-07-21: 1 via ORAL
  Filled 2015-07-21: qty 1

## 2015-07-21 MED ORDER — KETOROLAC TROMETHAMINE 60 MG/2ML IM SOLN
60.0000 mg | Freq: Once | INTRAMUSCULAR | Status: AC
  Administered 2015-07-21: 60 mg via INTRAMUSCULAR
  Filled 2015-07-21: qty 2

## 2015-07-21 MED ORDER — NAPROXEN 500 MG PO TABS: 500.0000 mg | ORAL_TABLET | Freq: Two times a day (BID) | ORAL | Status: DC

## 2015-07-21 MED ORDER — OXYCODONE-ACETAMINOPHEN 5-325 MG PO TABS: 1.0000 | ORAL_TABLET | ORAL | Status: DC | PRN

## 2015-07-21 NOTE — ED Notes (Signed)
See PA assessment note.

## 2015-07-21 NOTE — ED Notes (Signed)
Patient transported to X-ray 

## 2015-07-21 NOTE — ED Provider Notes (Signed)
CSN: [id number]     Arrival date & time 07/21/15  2102 History  By signing my name below, I, Megan Gardner, attest that this documentation has been prepared under the direction and in the presence of Ryler Laskowski, PA-C. Electronically Signed: Helane Gardner, ED Scribe. 07/21/2015. 10:23 PM.    Chief Complaint  Patient presents with  . Knee Pain   The history is provided by the patient. No language interpreter was used.   HPI Comments: Megan Gardner is a 50 y.o. female with a PMHx of asthma who presents to the Emergency Department complaining of constant, worsening, right knee pain onset this morning after waking up. Pt states she twisted the knee last night during football practice. Her football league is full contact American football. She reports associated swelling to the knee. She notes exacerbation of the pain with palpation and movement. She denies any previous injuries or surgeries to the right knee, but reports a previous injury to the left. She rates her pain as a 5/10 at rest, and a 7/10 after the physical exam. Pt denies fever/chills, nausea/vomiting, neuro deficits, or any other complaints. She reports NKDA.  Past Medical History  Diagnosis Date  . Asthma   . Fibroids   . Vitamin D deficiency   . Back pain   . Carpal tunnel syndrome     left wrist   Past Surgical History  Procedure Laterality Date  . Myomectomy     No family history on file. Social History  Substance Use Topics  . Smoking status: Never Smoker   . Smokeless tobacco: None  . Alcohol Use: 0.6 oz/week    0 Standard drinks or equivalent, 1 Glasses of wine per week   OB History    No data available     Review of Systems  Constitutional: Negative for fever.  Gastrointestinal: Negative for nausea and vomiting.  Musculoskeletal: Positive for joint swelling and arthralgias.  Neurological: Negative for weakness and numbness.    Allergies  Review of patient's allergies indicates no known allergies.  Home  Medications   Prior to Admission medications   Medication Sig Start Date End Date Taking? Authorizing Provider  ergocalciferol (VITAMIN D2) 50000 units capsule Take 1 capsule (50,000 Units total) by mouth once a week. 05/02/15   Shawnee Knapp, MD  naproxen (NAPROSYN) 500 MG tablet Take 1 tablet (500 mg total) by mouth 2 (two) times daily. 07/21/15   Celester Lech C Shaneika Rossa, PA-C  oxyCODONE-acetaminophen (PERCOCET/ROXICET) 5-325 MG tablet Take 1-2 tablets by mouth every 4 (four) hours as needed for severe pain. 07/21/15   Ryaan Vanwagoner C Ephrata Verville, PA-C   BP 143/88 mmHg  Pulse 74  Temp(Src) 98.4 F (36.9 C) (Oral)  Resp 18  SpO2 99%  LMP 07/18/2015 (Exact Date) Physical Exam  Constitutional: She is oriented to person, place, and time. She appears well-developed and well-nourished. No distress.  HENT:  Head: Normocephalic and atraumatic.  Eyes: Conjunctivae are normal. Right eye exhibits no discharge. Left eye exhibits no discharge.  Cardiovascular: Normal rate, regular rhythm and intact distal pulses.   Pulmonary/Chest: Effort normal. No respiratory distress.  Musculoskeletal: She exhibits tenderness.  TTP on the anterior inferior pole of the patella. No laxity, but pain increases with anterior movement of the tibia. CMS intact distal to injury.  Neurological: She is alert and oriented to person, place, and time. Coordination normal.  Sensory intact. Strength 5 out of 5.  Skin: Skin is warm and dry. No rash noted. She is not diaphoretic.  No erythema.  Psychiatric: She has a normal mood and affect. Her behavior is normal.  Nursing note and vitals reviewed.   ED Course  Procedures   DIAGNOSTIC STUDIES: Oxygen Saturation is 99% on RA, normal by my interpretation.   COORDINATION OF CARE: 9:31 PM - Discussed plans to order an XR and refer pt to an orthopedic surgeon. Will order medication for pain. Pt advised of plan for treatment and pt agrees.  Imaging Review Dg Knee Complete 4 Views Right  07/21/2015   CLINICAL DATA:  50 year old female with acute right knee pain following twisting injury yesterday. Initial encounter. EXAM: RIGHT KNEE - COMPLETE 4+ VIEW COMPARISON:  None. FINDINGS: There is no evidence of acute fracture, subluxation or dislocation. A small to moderate knee effusion is present. Moderate tricompartmental degenerative changes are identified. No focal bony lesions are present. IMPRESSION: Small-moderate knee effusion without acute bony abnormality. Moderate tricompartmental degenerative changes. Electronically Signed   By: Margarette Canada M.D.   On: 07/21/2015 21:59   I have personally reviewed and evaluated these images as part of my medical decision-making.   MDM   Final diagnoses:  Right knee pain  Right knee injury, initial encounter    Megan Gardner presents with right knee pain resulting from an injury that occurred last night.  This patient's sensation and history of injury gives evidence for internal soft tissue injury. X-ray shows no acute abnormalities. Pain control given. Patient to follow-up with orthopedics. Knee immobilizer indicated, but patient states that she already has one at home from a previous injury. Patient was placed in a knee sleeve instead and given crutches. Home care and return precautions discussed. Patient voiced understanding of these instructions and is comfortable with discharge.  Filed Vitals:   07/21/15 2209  BP: 143/88  Pulse: 74  Temp: 98.4 F (36.9 C)  TempSrc: Oral  Resp: 18  SpO2: 99%     I personally performed the services described in this documentation, which was scribed in my presence. The recorded information has been reviewed and is accurate.   Lorayne Bender, PA-C 07/21/15 2251  Noemi Chapel, MD 07/22/15 [phone]

## 2015-07-21 NOTE — ED Notes (Signed)
Ice pack provided to patient - applied to R knee.

## 2015-07-21 NOTE — ED Notes (Signed)
PA with patient at this time.

## 2015-07-21 NOTE — Discharge Instructions (Signed)
You have been seen today for a knee injury. Your imaging and lab tests showed no abnormalities.  Follow-up with orthopedics as soon as possible. Call the number provided to set up an appointment. Follow up with PCP as needed. Return to ED should symptoms worsen.

## 2015-07-21 NOTE — ED Notes (Signed)
Patient reports R knee pain which began when she awakened this morning. Reports knee pain has increased throughout the day. Patient believes that she may have twisted knee last night during football practice. R knee swollen. Rates pain 3/10 at rest & increases to 8/10 with ambulation.

## 2017-02-26 IMAGING — US US ABDOMEN COMPLETE
1 series · 13 of 25 positions shown · non-contrast
Comparison: Pelvis MRI 11/08/2004 and pelvis ultrasound 09/12/2003.

CLINICAL DATA: 48-year-old female with transaminitis. Initial
encounter.

EXAM:
ULTRASOUND ABDOMEN COMPLETE

[Series 1: us abdomen complete · 0.32mm/px · 13 of 80 slices shown]
[im 1/80]
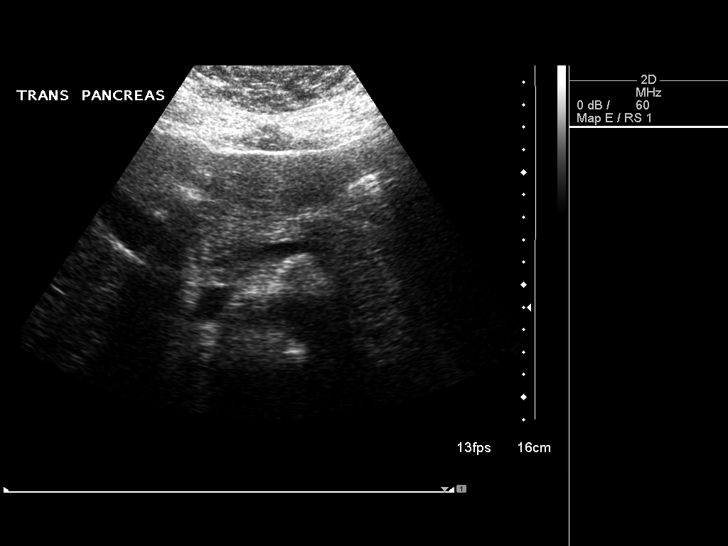
[im 7/80]
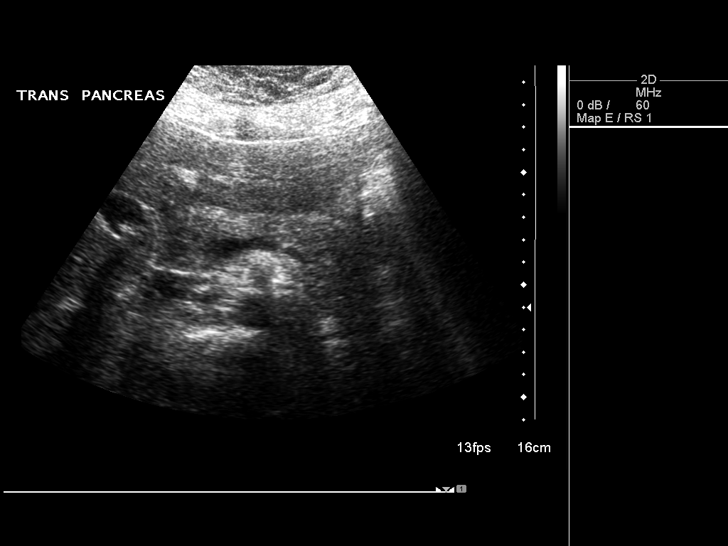
[im 14/80]
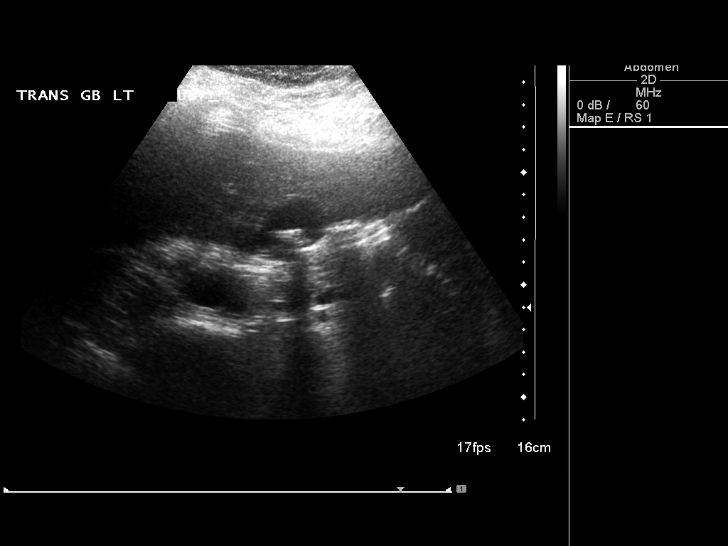
[im 20/80]
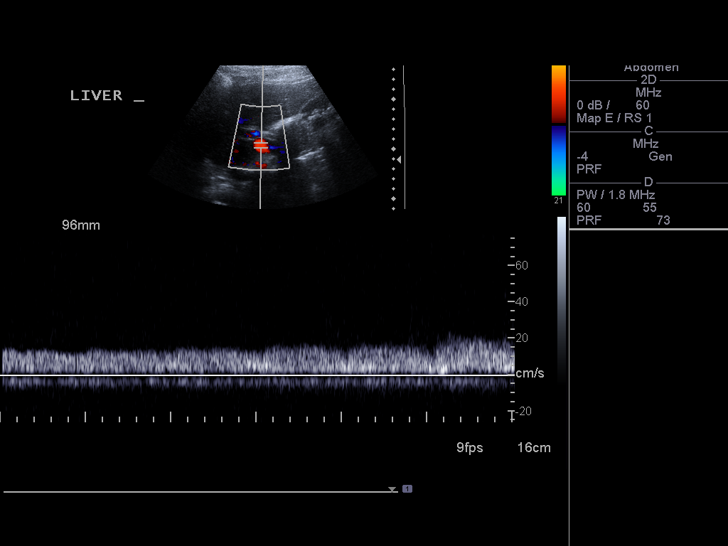
[im 27/80]
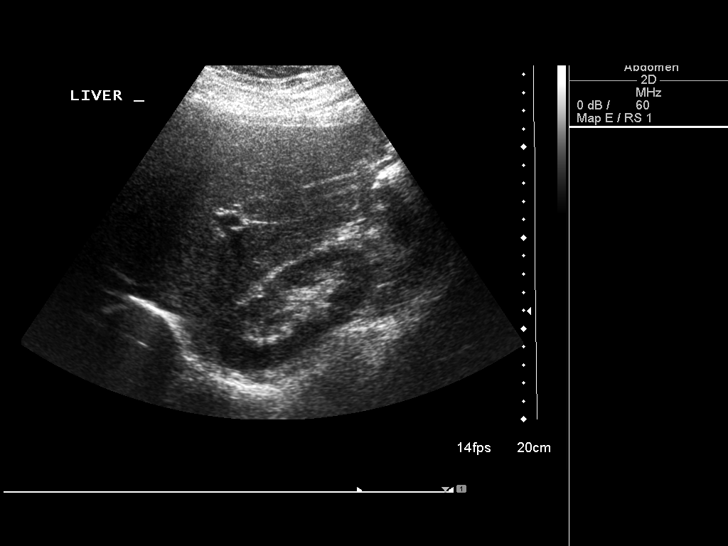
[im 33/80]
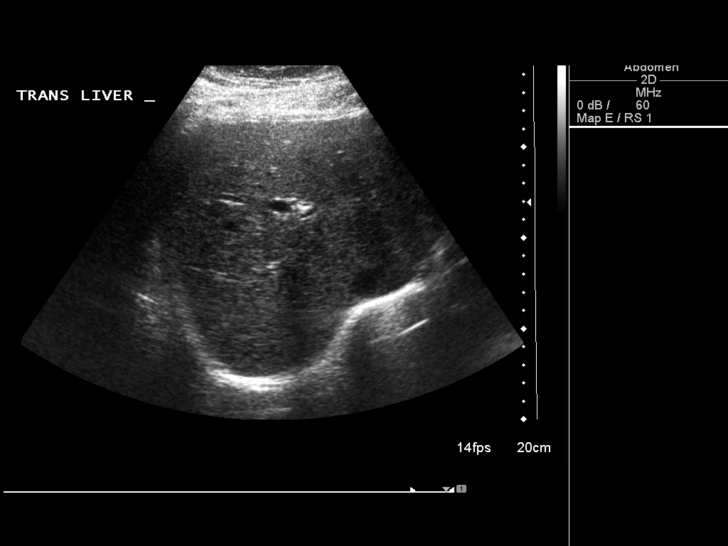
[im 40/80]
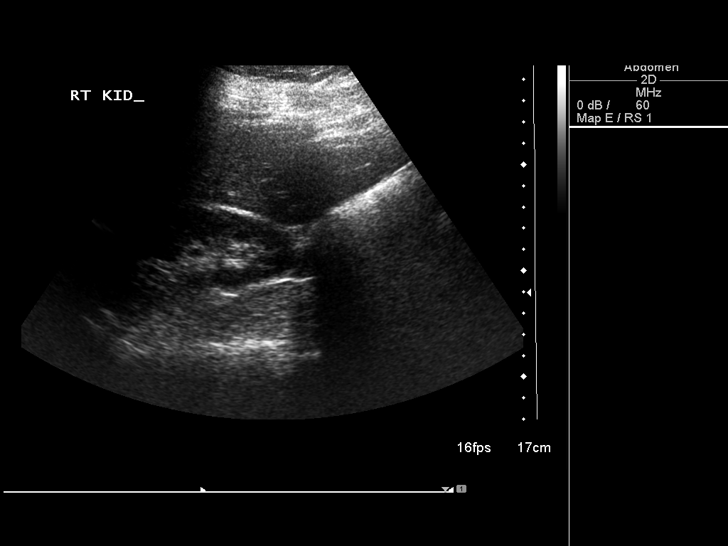
[im 47/80]
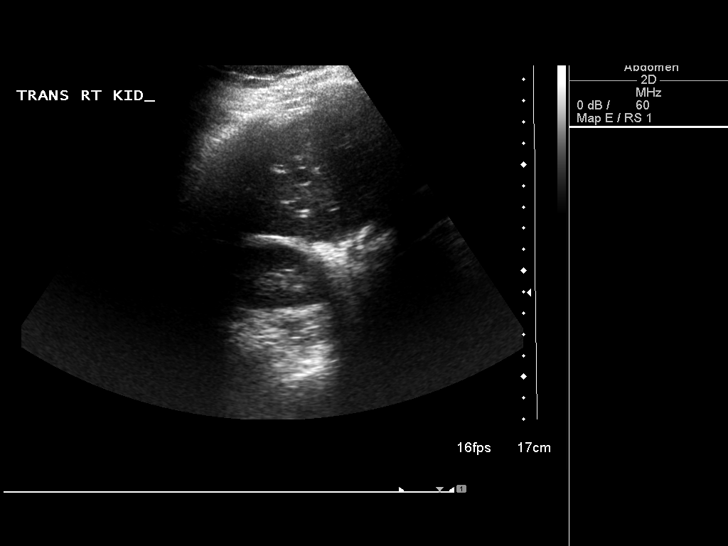
[im 53/80]
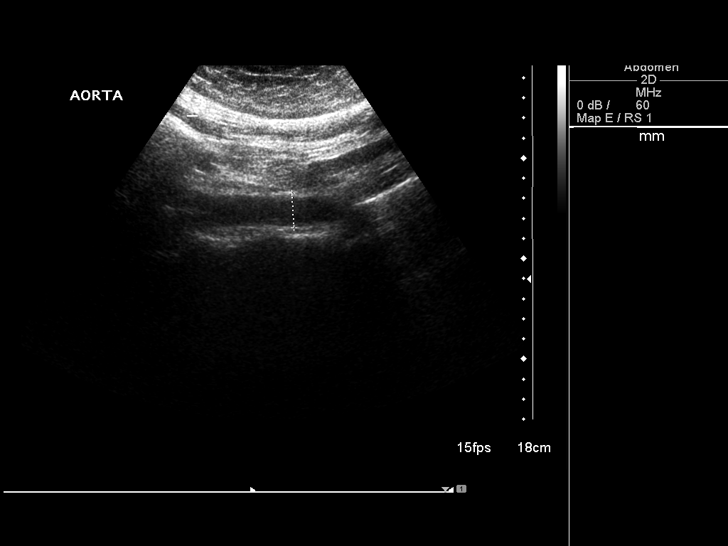
[im 60/80]
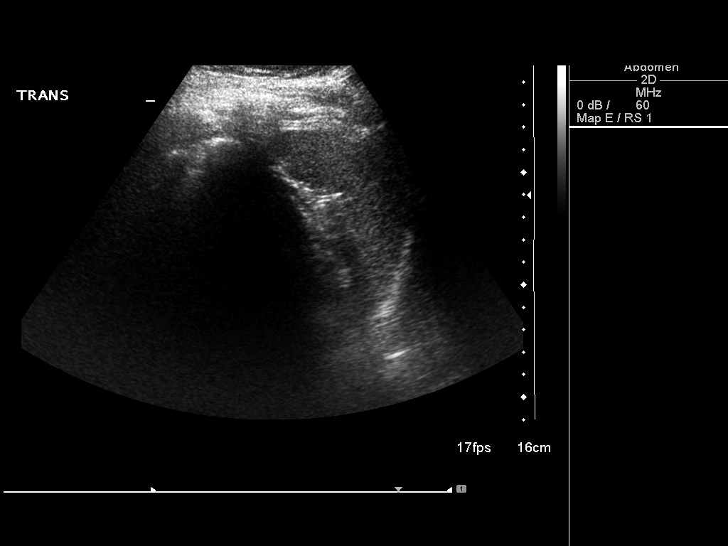
[im 66/80]
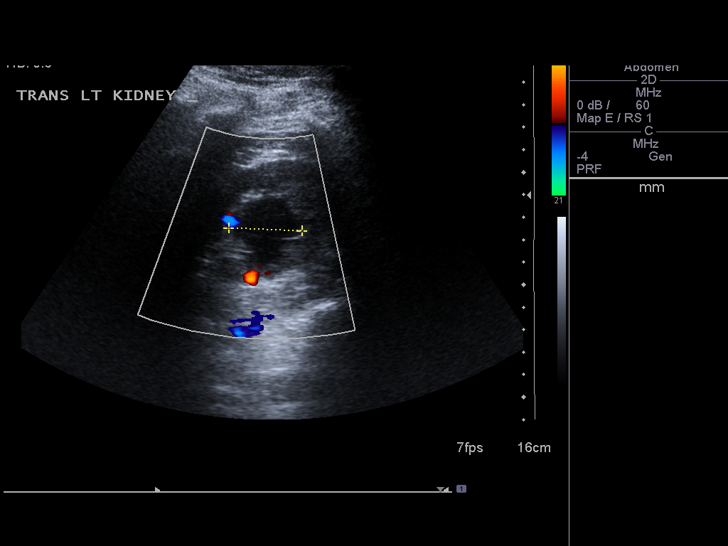
[im 73/80]
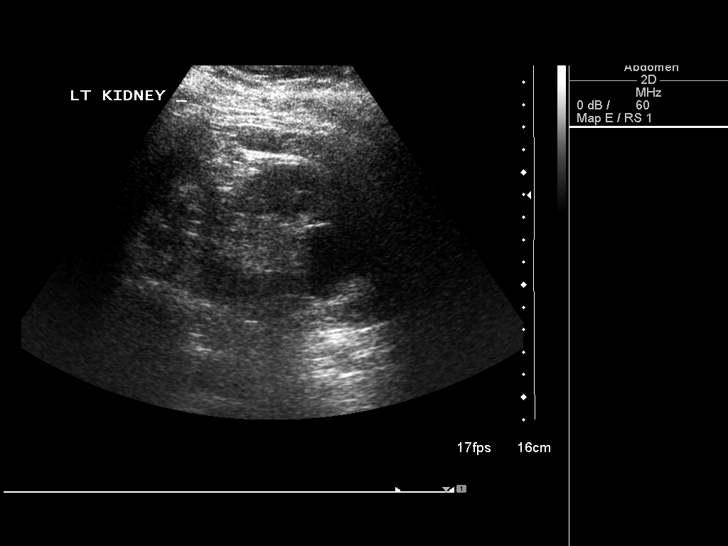
[im 80/80]
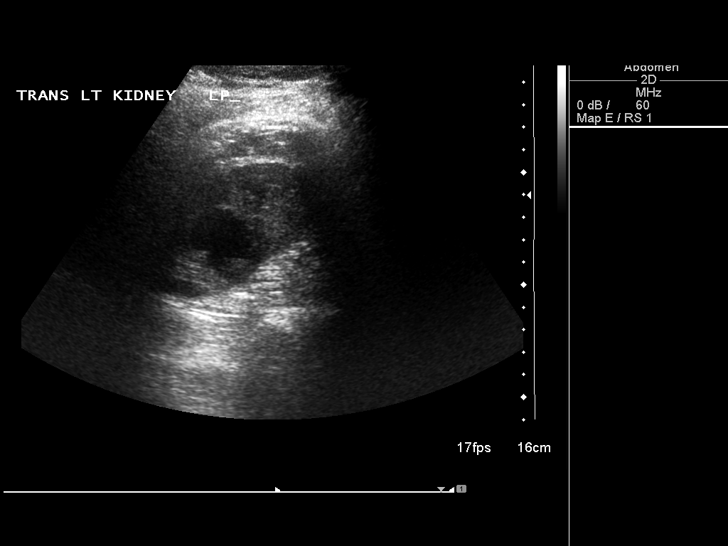

[13 of 25 positions shown; findings below may reference images not displayed]

FINDINGS: Gallbladder: Numerous shadowing gallstones. Stones individually
measure up to 2 cm diameter. Gallbladder wall thickness remains
normal at 1 to 2 mm. No pericholecystic fluid. No sonographic Murphy
sign elicited.

Common bile duct: Diameter: 5 mm, normal

Liver: No focal lesion identified. Within normal limits in
parenchymal echogenicity.

IVC: No abnormality visualized.

Pancreas: Visualized portion unremarkable.

Spleen: Size and appearance within normal limits.

Right Kidney: Length: 11.0 cm. Echogenicity within normal limits. No
mass or hydronephrosis visualized.

Left Kidney: Length: 10.0 cm. Midpole cyst with a single thin
septation measuring 3.8 cm. No suspicious vascularity on brief
Doppler evaluation. Echogenicity within normal limits. No mass or
hydronephrosis visualized.

Abdominal aorta: No aneurysm visualized.

Other findings: None.
IMPRESSION: 1. Cholelithiasis. No evidence of acute cholecystitis or biliary
obstruction.
2. Normal sonographic appearance of the liver.
3. Bosniak category 1 left renal cyst, no follow-up needed.

## 2017-10-06 LAB — HM MAMMOGRAPHY

## 2017-11-06 ENCOUNTER — Encounter: Payer: Self-pay | Admitting: *Deleted

## 2017-11-26 ENCOUNTER — Encounter: Payer: Self-pay | Admitting: Family Medicine

## 2017-11-26 ENCOUNTER — Ambulatory Visit (INDEPENDENT_AMBULATORY_CARE_PROVIDER_SITE_OTHER): Payer: Managed Care, Other (non HMO) | Admitting: Family Medicine

## 2017-11-26 ENCOUNTER — Other Ambulatory Visit: Payer: Self-pay

## 2017-11-26 VITALS — BP 126/82 | HR 79 | Temp 98.0°F | Resp 16 | Ht 65.75 in | Wt 227.0 lb

## 2017-11-26 DIAGNOSIS — R740 Nonspecific elevation of levels of transaminase and lactic acid dehydrogenase [LDH]: Secondary | ICD-10-CM

## 2017-11-26 DIAGNOSIS — Z6836 Body mass index (BMI) 36.0-36.9, adult: Secondary | ICD-10-CM

## 2017-11-26 DIAGNOSIS — E559 Vitamin D deficiency, unspecified: Secondary | ICD-10-CM | POA: Diagnosis not present

## 2017-11-26 DIAGNOSIS — Z131 Encounter for screening for diabetes mellitus: Secondary | ICD-10-CM

## 2017-11-26 DIAGNOSIS — Z1212 Encounter for screening for malignant neoplasm of rectum: Secondary | ICD-10-CM

## 2017-11-26 DIAGNOSIS — R7401 Elevation of levels of liver transaminase levels: Secondary | ICD-10-CM

## 2017-11-26 DIAGNOSIS — Z1211 Encounter for screening for malignant neoplasm of colon: Secondary | ICD-10-CM

## 2017-11-26 DIAGNOSIS — Z0001 Encounter for general adult medical examination with abnormal findings: Secondary | ICD-10-CM | POA: Diagnosis not present

## 2017-11-26 DIAGNOSIS — Z124 Encounter for screening for malignant neoplasm of cervix: Secondary | ICD-10-CM

## 2017-11-26 DIAGNOSIS — Z0000 Encounter for general adult medical examination without abnormal findings: Secondary | ICD-10-CM

## 2017-11-26 DIAGNOSIS — Z1329 Encounter for screening for other suspected endocrine disorder: Secondary | ICD-10-CM

## 2017-11-26 DIAGNOSIS — Z1383 Encounter for screening for respiratory disorder NEC: Secondary | ICD-10-CM | POA: Diagnosis not present

## 2017-11-26 DIAGNOSIS — E6609 Other obesity due to excess calories: Secondary | ICD-10-CM

## 2017-11-26 DIAGNOSIS — Z1389 Encounter for screening for other disorder: Secondary | ICD-10-CM

## 2017-11-26 DIAGNOSIS — Z136 Encounter for screening for cardiovascular disorders: Secondary | ICD-10-CM | POA: Diagnosis not present

## 2017-11-26 DIAGNOSIS — Z113 Encounter for screening for infections with a predominantly sexual mode of transmission: Secondary | ICD-10-CM

## 2017-11-26 LAB — POCT URINALYSIS DIP (MANUAL ENTRY)
Bilirubin, UA: NEGATIVE
Blood, UA: NEGATIVE
Glucose, UA: NEGATIVE mg/dL
Ketones, POC UA: NEGATIVE mg/dL
LEUKOCYTES UA: NEGATIVE
NITRITE UA: NEGATIVE
PROTEIN UA: NEGATIVE mg/dL
Spec Grav, UA: 1.015 (ref 1.010–1.025)
UROBILINOGEN UA: 4 U/dL — AB
pH, UA: 7.5 (ref 5.0–8.0)

## 2017-11-26 NOTE — Patient Instructions (Addendum)
IF you received an x-ray today, you will receive an invoice from [facility]. Please contact [facility] at [phone] with questions or concerns regarding your invoice.   IF you received labwork today, you will receive an invoice from [facility]. Please contact [facility] at [phone] with questions or concerns regarding your invoice.   Our billing staff will not be able to assist you with questions regarding bills from these companies.  You will be contacted with the lab results as soon as they are available. The fastest way to get your results is to activate your My Chart account. Instructions are located on the last page of this paperwork. If you have not heard from [place] regarding the results in 2 weeks, please contact this office.      Low Back Strain Rehab Ask your health care provider which exercises are safe for you. Do exercises exactly as told by your health care provider and adjust them as directed. It is normal to feel mild stretching, pulling, tightness, or discomfort as you do these exercises, but you should stop right away if you feel sudden pain or your pain gets worse. Do not begin these exercises until told by your health care provider. Stretching and range of motion exercises These exercises warm up your muscles and joints and improve the movement and flexibility of your back. These exercises also help to relieve pain, numbness, and tingling. Exercise A: Single knee to chest  Lie on your back on a firm surface with both legs straight. Bend one of your knees. Use your hands to move your knee up toward your chest until you feel a gentle stretch in your lower back and buttock. Hold your leg in this position by holding onto the front of your knee. Keep your other leg as straight as possible. Hold for __________ seconds. Slowly return to the starting position. Repeat with your other leg. Repeat __________ times. Complete this exercise __________ times a  day. Exercise B: Prone extension on elbows  Lie on your abdomen on a firm surface. Prop yourself up on your elbows. Use your arms to help lift your chest up until you feel a gentle stretch in your abdomen and your lower back. This will place some of your body weight on your elbows. If this is uncomfortable, try stacking pillows under your chest. Your hips should stay down, against the surface that you are lying on. Keep your hip and back muscles relaxed. Hold for __________ seconds. Slowly relax your upper body and return to the starting position. Repeat __________ times. Complete this exercise __________ times a day. Strengthening exercises These exercises build strength and endurance in your back. Endurance is the ability to use your muscles for a long time, even after they get tired. Exercise C: Pelvic tilt Lie on your back on a firm surface. Bend your knees and keep your feet flat. Tense your abdominal muscles. Tip your pelvis up toward the ceiling and flatten your lower back into the floor. To help with this exercise, you may place a small towel under your lower back and try to push your back into the towel. Hold for __________ seconds. Let your muscles relax completely before you repeat this exercise. Repeat __________ times. Complete this exercise __________ times a day. Exercise D: Alternating arm and leg raises  Get on your hands and knees on a firm surface. If you are on a hard floor, you may want to use padding to cushion your knees, such as an  exercise mat. Line up your arms and legs. Your hands should be below your shoulders, and your knees should be below your hips. Lift your left leg behind you. At the same time, raise your right arm and straighten it in front of you. Do not lift your leg higher than your hip. Do not lift your arm higher than your shoulder. Keep your abdominal and back muscles tight. Keep your hips facing the ground. Do not arch your back. Keep your  balance carefully, and do not hold your breath. Hold for __________ seconds. Slowly return to the starting position and repeat with your right leg and your left arm. Repeat __________ times. Complete this exercise __________times a day. Exercise J: Single leg lower with bent knees Lie on your back on a firm surface. Tense your abdominal muscles and lift your feet off the floor, one foot at a time, so your knees and hips are bent in an "L" shape (at about 90 degrees). Your knees should be over your hips and your lower legs should be parallel to the floor. Keeping your abdominal muscles tense and your knee bent, slowly lower one of your legs so your toe touches the ground. Lift your leg back up to return to the starting position. Do not hold your breath. Do not let your back arch. Keep your back flat against the ground. Repeat with your other leg. Repeat __________ times. Complete this exercise __________ times a day. Posture and body mechanics  Body mechanics refers to the movements and positions of your body while you do your daily activities. Posture is part of body mechanics. Good posture and healthy body mechanics can help to relieve stress in your body's tissues and joints. Good posture means that your spine is in its natural S-curve position (your spine is neutral), your shoulders are pulled back slightly, and your head is not tipped forward. The following are general guidelines for applying improved posture and body mechanics to your everyday activities. Standing  When standing, keep your spine neutral and your feet about hip-width apart. Keep a slight bend in your knees. Your ears, shoulders, and hips should line up. When you do a task in which you stand in one place for a long time, place one foot up on a stable object that is 2-4 inches (5-10 cm) high, such as a footstool. This helps keep your spine neutral. Sitting  When sitting, keep your spine neutral and keep your feet flat on the  floor. Use a footrest, if necessary, and keep your thighs parallel to the floor. Avoid rounding your shoulders, and avoid tilting your head forward. When working at a desk or a computer, keep your desk at a height where your hands are slightly lower than your elbows. Slide your chair under your desk so you are close enough to maintain good posture. When working at a computer, place your monitor at a height where you are looking straight ahead and you do not have to tilt your head forward or downward to look at the screen. Resting  When lying down and resting, avoid positions that are most painful for you. If you have pain with activities such as sitting, bending, stooping, or squatting (flexion-based activities), lie in a position in which your body does not bend very much. For example, avoid curling up on your side with your arms and knees near your chest (fetal position). If you have pain with activities such as standing for a long time or reaching with your arms (  extension-based activities), lie with your spine in a neutral position and bend your knees slightly. Try the following positions: Lying on your side with a pillow between your knees. Lying on your back with a pillow under your knees. Lifting  When lifting objects, keep your feet at least shoulder-width apart and tighten your abdominal muscles. Bend your knees and hips and keep your spine neutral. It is important to lift using the strength of your legs, not your back. Do not lock your knees straight out. Always ask for help to lift heavy or awkward objects. This information is not intended to replace advice given to you by your health care provider. Make sure you discuss any questions you have with your health care provider. Document Released: 04/08/2005 Document Revised: 12/14/2015 Document Reviewed: 01/18/2015 Elsevier Interactive Patient Education  2018 [employer].       Chronic Back Pain When back pain lasts longer than 3  months, it is called chronic back pain.The cause of your back pain may not be known. Some common causes include:  Wear and tear (degenerative disease) of the bones, ligaments, or disks in your back.  Inflammation and stiffness in your back (arthritis).  People who have chronic back pain often go through certain periods in which the pain is more intense (flare-ups). Many people can learn to manage the pain with home care. Follow these instructions at home: Pay attention to any changes in your symptoms. Take these actions to help with your pain: Activity  Avoid bending and activities that make the problem worse.  Do not sit or stand in one place for long periods of time.  Take brief periods of rest throughout the day. This will reduce your pain. Resting in a lying or standing position is usually better than sitting to rest.  When you are resting for longer periods, mix in some mild activity or stretching between periods of rest. This will help to prevent stiffness and pain.  Get regular exercise. Ask your health care provider what activities are safe for you.  Do not lift anything that is heavier than 10 lb (4.5 kg). Always use proper lifting technique, which includes: ? Bending your knees. ? Keeping the load close to your body. ? Avoiding twisting. Managing pain  If directed, apply ice to the painful area. Your health care provider may recommend applying ice during the first 24-48 hours after a flare-up begins. ? Put ice in a plastic bag. ? Place a towel between your skin and the bag. ? Leave the ice on for 20 minutes, 2-3 times per day.  After icing, apply heat to the affected area as often as told by your health care provider. Use the heat source that your health care provider recommends, such as a moist heat pack or a heating pad. ? Place a towel between your skin and the heat source. ? Leave the heat on for 20-30 minutes. ? Remove the heat if your skin turns bright red. This is  especially important if you are unable to feel pain, heat, or cold. You may have a greater risk of getting burned.  Try soaking in a warm tub.  Take over-the-counter and prescription medicines only as told by your health care provider.  Keep all follow-up visits as told by your health care provider. This is important. Contact a health care provider if:  You have pain that is not relieved with rest or medicine. Get help right away if:  You have weakness or numbness in one  or both of your legs or feet.  You have trouble controlling your bladder or your bowels.  You have nausea or vomiting.  You have pain in your abdomen.  You have shortness of breath or you faint. This information is not intended to replace advice given to you by your health care provider. Make sure you discuss any questions you have with your health care provider. Document Released: 05/16/2004 Document Revised: 08/17/2015 Document Reviewed: 09/26/2014 Elsevier Interactive Patient Education  2018 [place].   Calcium Intake Recommendations Calcium is a mineral that affects many functions in the body, including:  Blood clotting.  Blood vessel function.  Nerve impulse conduction.  Hormone secretion.  Muscle contraction.  Bone and teeth functions.  Most of your body's calcium supply is stored in your bones and teeth. When your calcium stores are low, you may be at risk for low bone mass, bone loss, and bone fractures. Consuming enough calcium helps to grow healthy bones and teeth and to prevent breakdown over time. It is very important that you get enough calcium if you are:  A child undergoing rapid growth.  An adolescent girl.  A pre- or post-menopausal woman.  A woman whose menstrual cycle has stopped due to anorexia nervosa or regular intense exercise.  An individual with lactose intolerance or a milk allergy.  A vegetarian.  What is my plan? Try to consume the recommended amount of calcium  daily based on your age. Depending on your overall health, your health care provider may recommend increased calcium intake.General daily calcium intake recommendations by age are:  Birth to 6 months: 200 mg.  Infants 7 to 12 months: 260 mg.  Children 1 to 3 years: 700 mg.  Children 4 to 8 years: 1,000 mg.  Children 9 to 13 years: 1,300 mg.  Teens 14 to 18 years: 1,300 mg.  Adults 19 to 50 years: 1,000 mg.  Adult women 51 to 70 years: 1,200 mg.  Adult men 51 to 70 years: 1,000 mg.  Adults 71 years and older: 1,200 mg.  Pregnant and breastfeeding teens: 1,300 mg.  Pregnant and breastfeeding adults: 1,000 mg.  What do I need to know about calcium intake?  In order for the body to absorb calcium, it needs vitamin D. You can get vitamin D through: ? Direct exposure of the skin to sunlight. ? Foods, such as egg yolks, liver, saltwater fish, and fortified milk. ? Supplements.  Consuming too much calcium may cause: ? Constipation. ? Decreased absorption of iron and zinc. ? Kidney stones.  Calcium supplements may interact with certain medicines. Check with your health care provider before starting any calcium supplements.  Try to get most of your calcium from food. What foods can I eat? Grains  Fortified oatmeal. Fortified ready-to-eat cereals. Fortified frozen waffles. Vegetables Turnip greens. Broccoli. Fruits Fortified orange juice. Meats and Other Protein Sources Canned sardines with bones. Canned salmon with bones. Soy beans. Tofu. Baked beans. Almonds. [place] nuts. Sunflower seeds. Dairy Milk. Yogurt. Cheese. Cottage cheese. Beverages Fortified soy milk. Fortified rice milk. Sweets/Desserts Pudding. Ice Cream. Milkshakes. Blackstrap molasses. The items listed above may not be a complete list of recommended foods or beverages. Contact your dietitian for more options. What foods can affect my calcium intake? It may be more difficult for your body to use calcium  or calcium may leave your body more quickly if you consume large amounts of:  Sodium.  Protein.  Caffeine.  Alcohol.  This information is not intended to  replace advice given to you by your health care provider. Make sure you discuss any questions you have with your health care provider. Document Released: 11/21/2003 Document Revised: 10/27/2015 Document Reviewed: 09/14/2013 Elsevier Interactive Patient Education  2018 [employer].

## 2017-11-26 NOTE — Progress Notes (Signed)
Subjective:    Patient ID: Megan Gardner, female    DOB: 1965/04/24, 52 y.o.   MRN: [id number] Chief Complaint  Patient presents with  . Annual Exam    HPI   Primary Preventative Screenings: Cervical Cancer: no h/o abnml  Family Planning: not sexually active currently, menses stil very regular STI screening:ok with having done Breast Cancer:  Colorectal Cancer: Tobacco use/EtOH/substances: Bone Density: Cardiac: Weight/Blood sugar/Diet/Exercise: BMI Readings from Last 3 Encounters:  11/26/17 36.92 kg/m  04/13/15 36.64 kg/m  11/21/14 35.75 kg/m   Lab Results  Component Value Date   HGBA1C 5.6 10/14/2014   OTC/Vit/Supp/Herbal: Dentist/Optho: Immunizations:   There is no immunization history on file for this patient.   Chronic Medical Conditions:     Past Medical History:  Diagnosis Date  . Asthma   . Back pain   . Carpal tunnel syndrome    left wrist  . Fibroids   . Vitamin D deficiency    Past Surgical History:  Procedure Laterality Date  . MYOMECTOMY     Current Outpatient Medications on File Prior to Visit  Medication Sig Dispense Refill  . naproxen (NAPROSYN) 500 MG tablet Take 1 tablet (500 mg total) by mouth 2 (two) times daily. 30 tablet 0   No current facility-administered medications on file prior to visit.    No Known Allergies History reviewed. No pertinent family history. Social History   Socioeconomic History  . Marital status: Single    Spouse name: Not on file  . Number of children: 0  . Years of education: Not on file  . Highest education level: Not on file  Occupational History  . Occupation: bus Geophysicist/field seismologist  Social Needs  . Financial resource strain: Not on file  . Food insecurity:    Worry: Not on file    Inability: Not on file  . Transportation needs:    Medical: Not on file    Non-medical: Not on file  Tobacco Use  . Smoking status: Never Smoker  . Smokeless tobacco: Never Used  Substance and Sexual Activity  .  Alcohol use: Yes    Alcohol/week: 0.6 oz    Types: 1 Glasses of wine per week  . Drug use: No  . Sexual activity: Not on file  Lifestyle  . Physical activity:    Days per week: Not on file    Minutes per session: Not on file  . Stress: Not on file  Relationships  . Social connections:    Talks on phone: Not on file    Gets together: Not on file    Attends religious service: Not on file    Active member of club or organization: Not on file    Attends meetings of clubs or organizations: Not on file    Relationship status: Not on file  Other Topics Concern  . Not on file  Social History Narrative  . Not on file   Depression screen [facility] 2/9 11/26/2017 04/13/2015 10/14/2014  Decreased Interest 0 0 0  Down, Depressed, Hopeless 0 0 0  PHQ - 2 Score 0 0 0    Review of Systems  All other systems reviewed and are negative.  See hpi    Objective:   Physical Exam  Constitutional: She is oriented to person, place, and time. She appears well-developed and well-nourished. No distress.  HENT:  Head: Normocephalic and atraumatic.  Right Ear: Tympanic membrane, external ear and ear canal normal.  Left Ear: Tympanic membrane, external ear and  ear canal normal.  Nose: Nose normal. No mucosal edema or rhinorrhea.  Mouth/Throat: Uvula is midline, oropharynx is clear and moist and mucous membranes are normal. No posterior oropharyngeal erythema.  Eyes: Pupils are equal, round, and reactive to light. Conjunctivae and EOM are normal. Right eye exhibits no discharge. Left eye exhibits no discharge. No scleral icterus.  Neck: Normal range of motion. Neck supple. No thyromegaly present.  Cardiovascular: Normal rate, regular rhythm, normal heart sounds and intact distal pulses.  Pulmonary/Chest: Effort normal and breath sounds normal. No respiratory distress. No breast tenderness, discharge or bleeding.  Abdominal: Soft. Bowel sounds are normal. There is no tenderness.  Genitourinary: Vagina normal  and uterus normal. Cervix exhibits no motion tenderness and no friability. Right adnexum displays no mass and no tenderness. Left adnexum displays no mass and no tenderness.  Musculoskeletal: She exhibits no edema.  Lymphadenopathy:    She has no cervical adenopathy.  Neurological: She is alert and oriented to person, place, and time. She has normal reflexes.  Skin: Skin is warm and dry. She is not diaphoretic. No erythema.  Psychiatric: She has a normal mood and affect. Her behavior is normal.    BP 126/82   Pulse 79   Temp 98 F (36.7 C) (Oral)   Resp 16   Ht 5' 5.75" (1.67 m)   Wt 227 lb (103 kg)   LMP 11/19/2017 (Approximate)   SpO2 98%   BMI 36.92 kg/m     Visual Acuity Screening   Right eye Left eye Both eyes  Without correction: 20/30 20/30 20/30   With correction:      EKG: NSR, no acute ischemic changes noted. No significant change noted when compared to prior EKG done 10/14/2014.   I have personally reviewed the EKG tracing and agree with the computer interpretation.  WITHIN NORMAL LIMITS    Assessment & Plan:   1. Annual physical exam   2. Screening for cardiovascular, respiratory, and genitourinary diseases   3. Screening for colorectal cancer   4. Screening for cervical cancer   5. Routine screening for STI (sexually transmitted infection)   6. Vitamin D deficiency   7. Transaminitis   8. Screening for thyroid disorder   9. Class 2 obesity due to excess calories without serious comorbidity with body mass index (BMI) of 36.0 to 36.9 in adult   10. Screening for diabetes mellitus     Orders Placed This Encounter  Procedures  . CBC with Differential/Platelet  . Comprehensive metabolic panel    Order Specific Question:   Has the patient fasted?    Answer:   Yes  . TSH  . Lipid panel    Order Specific Question:   Has the patient fasted?    Answer:   Yes  . HIV antibody  . HCV Ab w/Rflx to Verification  . RPR  . VITAMIN D 25 Hydroxy (Vit-D Deficiency,  Fractures)  . Hemoglobin A1c  . Interpretation:  . Ambulatory referral to Gastroenterology    Referral Priority:   Routine    Referral Type:   Consultation    Referral Reason:   Specialty Services Required    Number of Visits Requested:   1  . POCT urinalysis dipstick  . EKG 12-Lead     Delman Cheadle, MD, MPH [facility], [place]  [address] [phone] Office phone  [phone] Office fax   02/08/18 11:39 AM

## 2017-11-27 ENCOUNTER — Encounter: Payer: Self-pay | Admitting: Gastroenterology

## 2017-11-27 LAB — COMPREHENSIVE METABOLIC PANEL
A/G RATIO: 1.4 (ref 1.2–2.2)
ALBUMIN: 3.9 g/dL (ref 3.5–5.5)
ALT: 39 IU/L — ABNORMAL HIGH (ref 0–32)
AST: 37 IU/L (ref 0–40)
Alkaline Phosphatase: 77 IU/L (ref 39–117)
BUN/Creatinine Ratio: 22 (ref 9–23)
BUN: 17 mg/dL (ref 6–24)
Bilirubin Total: 0.3 mg/dL (ref 0.0–1.2)
CALCIUM: 9.1 mg/dL (ref 8.7–10.2)
CO2: 25 mmol/L (ref 20–29)
CREATININE: 0.79 mg/dL (ref 0.57–1.00)
Chloride: 105 mmol/L (ref 96–106)
GFR, EST AFRICAN AMERICAN: 100 mL/min/{1.73_m2} (ref >59)
GFR, EST NON AFRICAN AMERICAN: 86 mL/min/{1.73_m2} (ref >59)
GLOBULIN, TOTAL: 2.8 g/dL (ref 1.5–4.5)
Glucose: 98 mg/dL (ref 65–99)
POTASSIUM: 4.6 mmol/L (ref 3.5–5.2)
SODIUM: 142 mmol/L (ref 134–144)
TOTAL PROTEIN: 6.7 g/dL (ref 6.0–8.5)

## 2017-11-27 LAB — HEMOGLOBIN A1C
Est. average glucose Bld gHb Est-mCnc: 120 mg/dL
HEMOGLOBIN A1C: 5.8 % — AB (ref 4.8–5.6)

## 2017-11-27 LAB — CBC WITH DIFFERENTIAL/PLATELET
BASOS: 0 %
Basophils Absolute: 0 10*3/uL (ref 0.0–0.2)
EOS (ABSOLUTE): 0.2 10*3/uL (ref 0.0–0.4)
EOS: 4 %
HEMATOCRIT: 38.3 % (ref 34.0–46.6)
HEMOGLOBIN: 12.6 g/dL (ref 11.1–15.9)
IMMATURE GRANS (ABS): 0 10*3/uL (ref 0.0–0.1)
IMMATURE GRANULOCYTES: 0 %
LYMPHS: 43 %
Lymphocytes Absolute: 2.6 10*3/uL (ref 0.7–3.1)
MCH: 28.4 pg (ref 26.6–33.0)
MCHC: 32.9 g/dL (ref 31.5–35.7)
MCV: 87 fL (ref 79–97)
MONOCYTES: 8 %
MONOS ABS: 0.5 10*3/uL (ref 0.1–0.9)
NEUTROS PCT: 45 %
Neutrophils Absolute: 2.8 10*3/uL (ref 1.4–7.0)
Platelets: 290 10*3/uL (ref 150–450)
RBC: 4.43 x10E6/uL (ref 3.77–5.28)
RDW: 14.6 % (ref 12.3–15.4)
WBC: 6 10*3/uL (ref 3.4–10.8)

## 2017-11-27 LAB — LIPID PANEL
CHOL/HDL RATIO: 3 ratio (ref 0.0–4.4)
Cholesterol, Total: 164 mg/dL (ref 100–199)
HDL: 55 mg/dL (ref >39)
LDL CALC: 103 mg/dL — AB (ref 0–99)
Triglycerides: 29 mg/dL (ref 0–149)
VLDL CHOLESTEROL CAL: 6 mg/dL (ref 5–40)

## 2017-11-27 LAB — HIV ANTIBODY (ROUTINE TESTING W REFLEX): HIV Screen 4th Generation wRfx: NONREACTIVE

## 2017-11-27 LAB — HCV AB W/RFLX TO VERIFICATION

## 2017-11-27 LAB — VITAMIN D 25 HYDROXY (VIT D DEFICIENCY, FRACTURES): Vit D, 25-Hydroxy: 25.1 ng/mL — ABNORMAL LOW (ref 30.0–100.0)

## 2017-11-27 LAB — HCV INTERPRETATION

## 2017-11-27 LAB — RPR: RPR: NONREACTIVE

## 2017-11-27 LAB — TSH: TSH: 1.84 u[IU]/mL (ref 0.450–4.500)

## 2017-12-04 LAB — PAP IG, CT-NG NAA, HPV HIGH-RISK
CHLAMYDIA, NUC. ACID AMP: NEGATIVE
Gonococcus by Nucleic Acid Amp: NEGATIVE
HPV, HIGH-RISK: NEGATIVE
PAP SMEAR COMMENT: 0

## 2018-01-13 ENCOUNTER — Ambulatory Visit (AMBULATORY_SURGERY_CENTER): Payer: Self-pay | Admitting: *Deleted

## 2018-01-13 VITALS — Ht 64.0 in | Wt 231.8 lb

## 2018-01-13 DIAGNOSIS — Z1211 Encounter for screening for malignant neoplasm of colon: Secondary | ICD-10-CM

## 2018-01-13 MED ORDER — NA SULFATE-K SULFATE-MG SULF 17.5-3.13-1.6 GM/177ML PO SOLN: 1.0000 | Freq: Once | ORAL | 0 refills | Status: AC

## 2018-01-13 NOTE — Progress Notes (Signed)
No egg or soy allergy known to patient  No issues with past sedation with any surgeries  or procedures, no intubation problems  No diet pills per patient No home 02 use per patient  No blood thinners per patient  Pt denies issues with constipation  No A fib or A flutter  EMMI video sent to pt's e mail   Suprep $15 coupon to pt in PV today    

## 2018-01-14 ENCOUNTER — Encounter: Payer: Self-pay | Admitting: Gastroenterology

## 2018-01-27 ENCOUNTER — Ambulatory Visit (AMBULATORY_SURGERY_CENTER): Payer: Managed Care, Other (non HMO) | Admitting: Gastroenterology

## 2018-01-27 ENCOUNTER — Encounter: Payer: Self-pay | Admitting: Gastroenterology

## 2018-01-27 VITALS — BP 157/84 | HR 69 | Temp 96.4°F | Resp 13 | Ht 64.0 in | Wt 231.0 lb

## 2018-01-27 DIAGNOSIS — Z1211 Encounter for screening for malignant neoplasm of colon: Secondary | ICD-10-CM | POA: Diagnosis not present

## 2018-01-27 MED ORDER — SODIUM CHLORIDE 0.9 % IV SOLN: 500.0000 mL | Freq: Once | INTRAVENOUS | Status: DC

## 2018-01-27 NOTE — Progress Notes (Signed)
Report to PACU, RN, vss, BBS= Clear.  

## 2018-01-27 NOTE — Patient Instructions (Signed)
YOU HAD AN ENDOSCOPIC PROCEDURE TODAY AT THE [facility]:   Refer to the procedure report that was given to you for any specific questions about what was found during the examination.  If the procedure report does not answer your questions, please call your gastroenterologist to clarify.  If you requested that your care partner not be given the details of your procedure findings, then the procedure report has been included in a sealed envelope for you to review at your convenience later.  YOU SHOULD EXPECT: Some feelings of bloating in the abdomen. Passage of more gas than usual.  Walking can help get rid of the air that was put into your GI tract during the procedure and reduce the bloating. If you had a lower endoscopy (such as a colonoscopy or flexible sigmoidoscopy) you may notice spotting of blood in your stool or on the toilet paper. If you underwent a bowel prep for your procedure, you may not have a normal bowel movement for a few days.  Please Note:  You might notice some irritation and congestion in your nose or some drainage.  This is from the oxygen used during your procedure.  There is no need for concern and it should clear up in a day or so.  SYMPTOMS TO REPORT IMMEDIATELY:   Following lower endoscopy (colonoscopy or flexible sigmoidoscopy):  Excessive amounts of blood in the stool  Significant tenderness or worsening of abdominal pains  Swelling of the abdomen that is new, acute  Fever of 100F or higher   For urgent or emergent issues, a gastroenterologist can be reached at any hour by calling [phone].   DIET:  We do recommend a small meal at first, but then you may proceed to your regular diet.  Drink plenty of fluids but you should avoid alcoholic beverages for 24 hours.  ACTIVITY:  You should plan to take it easy for the rest of today and you should NOT DRIVE or use heavy machinery until tomorrow (because of the sedation medicines used during the test).     FOLLOW UP: Our staff will call the number listed on your records the next business day following your procedure to check on you and address any questions or concerns that you may have regarding the information given to you following your procedure. If we do not reach you, we will leave a message.  However, if you are feeling well and you are not experiencing any problems, there is no need to return our call.  We will assume that you have returned to your regular daily activities without incident.  If any biopsies were taken you will be contacted by phone or by letter within the next 1-3 weeks.  Please call us at [phone] if you have not heard about the biopsies in 3 weeks.    SIGNATURES/CONFIDENTIALITY: You and/or your care partner have signed paperwork which will be entered into your electronic medical record.  These signatures attest to the fact that that the information above on your After Visit Summary has been reviewed and is understood.  Full responsibility of the confidentiality of this discharge information lies with you and/or your care-partner.  Thank you for letting us take care of your healthcare needs today. 

## 2018-01-27 NOTE — Progress Notes (Signed)
Pt's states no medical or surgical changes since previsit or office visit. 

## 2018-01-27 NOTE — Op Note (Signed)
[place] Patient Name: Megan Gardner Procedure Date: 01/27/2018 10:19 AM MRN: [id number] Endoscopist: Ladene Artist , MD Age: 52 Referring MD:  Date of Birth: 08-01-1965 Gender: Female Account #: [id number] Procedure:                Colonoscopy Indications:              Screening for colorectal malignant neoplasm Medicines:                Monitored Anesthesia Care Procedure:                Pre-Anesthesia Assessment:                           - Prior to the procedure, a History and Physical                            was performed, and patient medications and                            allergies were reviewed. The patient's tolerance of                            previous anesthesia was also reviewed. The risks                            and benefits of the procedure and the sedation                            options and risks were discussed with the patient.                            All questions were answered, and informed consent                            was obtained. Prior Anticoagulants: The patient has                            taken no previous anticoagulant or antiplatelet                            agents. ASA Grade Assessment: II - A patient with                            mild systemic disease. After reviewing the risks                            and benefits, the patient was deemed in                            satisfactory condition to undergo the procedure.                           After obtaining informed consent, the colonoscope  was passed under direct vision. Throughout the                            procedure, the patient's blood pressure, pulse, and                            oxygen saturations were monitored continuously. The                            Colonoscope was introduced through the anus and                            advanced to the the cecum, identified by                            appendiceal orifice and  ileocecal valve. The                            colonoscopy was performed without difficulty. The                            patient tolerated the procedure well. The quality                            of the bowel preparation was excellent. Scope In: 10:37:46 AM Scope Out: 10:50:26 AM Scope Withdrawal Time: 0 hours 8 minutes 50 seconds  Total Procedure Duration: 0 hours 12 minutes 40 seconds  Findings:                 The perianal and digital rectal examinations were                            normal.                           The entire examined colon appeared normal on direct                            and retroflexion views. Complications:            No immediate complications. Estimated Blood Loss:     Estimated blood loss: none. Impression:               - The entire examined colon is normal on direct and                            retroflexion views.                           - No specimens collected. Recommendation:           - Patient has a contact number available for                            emergencies. The signs and symptoms of potential  delayed complications were discussed with the                            patient. Return to normal activities tomorrow.                            Written discharge instructions were provided to the                            patient.                           - Resume previous diet.                           - Continue present medications.                           - Repeat colonoscopy in 10 years for screening                            purposes. Ladene Artist, MD 01/27/2018 10:53:17 AM This report has been signed electronically.

## 2018-01-28 ENCOUNTER — Telehealth: Payer: Self-pay

## 2018-01-28 NOTE — Telephone Encounter (Signed)
  Follow up Call-  Call back number 01/27/2018  Post procedure Call Back phone  # [phone]  Permission to leave phone message Yes  Some recent data might be hidden     Patient questions:  Do you have a fever, pain , or abdominal swelling? No. Pain Score  0 *  Have you tolerated food without any problems? Yes.    Have you been able to return to your normal activities? Yes.    Do you have any questions about your discharge instructions: Diet   No. Medications  No. Follow up visit  No.  Do you have questions or concerns about your Care? No.  Actions: * If pain score is 4 or above: No action needed, pain <4.

## 2018-02-09 ENCOUNTER — Encounter: Payer: Self-pay | Admitting: *Deleted

## 2018-03-14 ENCOUNTER — Inpatient Hospital Stay (HOSPITAL_COMMUNITY)
Admission: EM | Admit: 2018-03-14 | Discharge: 2018-03-17 | DRG: 872 | Disposition: A | Discharge status: Home / Self Care | Payer: Managed Care, Other (non HMO) | Attending: Internal Medicine | Admitting: Internal Medicine

## 2018-03-14 DIAGNOSIS — E559 Vitamin D deficiency, unspecified: Secondary | ICD-10-CM | POA: Diagnosis present

## 2018-03-14 DIAGNOSIS — M549 Dorsalgia, unspecified: Secondary | ICD-10-CM | POA: Diagnosis present

## 2018-03-14 DIAGNOSIS — G8929 Other chronic pain: Secondary | ICD-10-CM | POA: Diagnosis present

## 2018-03-14 DIAGNOSIS — A419 Sepsis, unspecified organism: Principal | ICD-10-CM | POA: Diagnosis present

## 2018-03-14 DIAGNOSIS — R590 Localized enlarged lymph nodes: Secondary | ICD-10-CM | POA: Diagnosis present

## 2018-03-14 DIAGNOSIS — J45909 Unspecified asthma, uncomplicated: Secondary | ICD-10-CM | POA: Diagnosis present

## 2018-03-14 DIAGNOSIS — L03211 Cellulitis of face: Secondary | ICD-10-CM | POA: Diagnosis present

## 2018-03-14 DIAGNOSIS — K649 Unspecified hemorrhoids: Secondary | ICD-10-CM | POA: Diagnosis present

## 2018-03-14 DIAGNOSIS — Z79899 Other long term (current) drug therapy: Secondary | ICD-10-CM

## 2018-03-14 DIAGNOSIS — G5600 Carpal tunnel syndrome, unspecified upper limb: Secondary | ICD-10-CM | POA: Diagnosis present

## 2018-03-14 DIAGNOSIS — R740 Nonspecific elevation of levels of transaminase and lactic acid dehydrogenase [LDH]: Secondary | ICD-10-CM | POA: Diagnosis present

## 2018-03-14 DIAGNOSIS — R7401 Elevation of levels of liver transaminase levels: Secondary | ICD-10-CM | POA: Diagnosis present

## 2018-03-14 DIAGNOSIS — R591 Generalized enlarged lymph nodes: Secondary | ICD-10-CM

## 2018-03-15 ENCOUNTER — Encounter (HOSPITAL_COMMUNITY): Payer: Self-pay

## 2018-03-15 ENCOUNTER — Other Ambulatory Visit: Payer: Self-pay

## 2018-03-15 ENCOUNTER — Emergency Department (HOSPITAL_COMMUNITY): Payer: Managed Care, Other (non HMO)

## 2018-03-15 DIAGNOSIS — A419 Sepsis, unspecified organism: Secondary | ICD-10-CM | POA: Diagnosis present

## 2018-03-15 DIAGNOSIS — R740 Nonspecific elevation of levels of transaminase and lactic acid dehydrogenase [LDH]: Secondary | ICD-10-CM | POA: Diagnosis present

## 2018-03-15 DIAGNOSIS — R591 Generalized enlarged lymph nodes: Secondary | ICD-10-CM | POA: Diagnosis not present

## 2018-03-15 DIAGNOSIS — M549 Dorsalgia, unspecified: Secondary | ICD-10-CM | POA: Diagnosis present

## 2018-03-15 DIAGNOSIS — J45909 Unspecified asthma, uncomplicated: Secondary | ICD-10-CM | POA: Diagnosis present

## 2018-03-15 DIAGNOSIS — G8929 Other chronic pain: Secondary | ICD-10-CM | POA: Diagnosis present

## 2018-03-15 DIAGNOSIS — K649 Unspecified hemorrhoids: Secondary | ICD-10-CM | POA: Diagnosis present

## 2018-03-15 DIAGNOSIS — G5600 Carpal tunnel syndrome, unspecified upper limb: Secondary | ICD-10-CM | POA: Diagnosis present

## 2018-03-15 DIAGNOSIS — L03211 Cellulitis of face: Secondary | ICD-10-CM | POA: Diagnosis present

## 2018-03-15 DIAGNOSIS — R590 Localized enlarged lymph nodes: Secondary | ICD-10-CM | POA: Diagnosis present

## 2018-03-15 DIAGNOSIS — E559 Vitamin D deficiency, unspecified: Secondary | ICD-10-CM | POA: Diagnosis present

## 2018-03-15 DIAGNOSIS — Z79899 Other long term (current) drug therapy: Secondary | ICD-10-CM | POA: Diagnosis not present

## 2018-03-15 LAB — BASIC METABOLIC PANEL
Anion gap: 7 (ref 5–15)
BUN: 7 mg/dL (ref 6–20)
CHLORIDE: 100 mmol/L (ref 98–111)
CO2: 27 mmol/L (ref 22–32)
CREATININE: 0.98 mg/dL (ref 0.44–1.00)
Calcium: 9.1 mg/dL (ref 8.9–10.3)
GFR calc Af Amer: >60 mL/min (ref >60)
GFR calc non Af Amer: >60 mL/min (ref >60)
Glucose, Bld: 120 mg/dL — ABNORMAL HIGH (ref 70–99)
Potassium: 4 mmol/L (ref 3.5–5.1)
Sodium: 134 mmol/L — ABNORMAL LOW (ref 135–145)

## 2018-03-15 LAB — CBC WITH DIFFERENTIAL/PLATELET
Abs Immature Granulocytes: 0.11 10*3/uL — ABNORMAL HIGH (ref 0.00–0.07)
Basophils Absolute: 0 10*3/uL (ref 0.0–0.1)
Basophils Relative: 0 %
EOS PCT: 1 %
Eosinophils Absolute: 0.1 10*3/uL (ref 0.0–0.5)
HEMATOCRIT: 38.7 % (ref 36.0–46.0)
HEMOGLOBIN: 12.5 g/dL (ref 12.0–15.0)
Immature Granulocytes: 1 %
LYMPHS PCT: 16 %
Lymphs Abs: 2.2 10*3/uL (ref 0.7–4.0)
MCH: 28.2 pg (ref 26.0–34.0)
MCHC: 32.3 g/dL (ref 30.0–36.0)
MCV: 87.4 fL (ref 80.0–100.0)
MONO ABS: 1.2 10*3/uL — AB (ref 0.1–1.0)
Monocytes Relative: 9 %
Neutro Abs: 10.6 10*3/uL — ABNORMAL HIGH (ref 1.7–7.7)
Neutrophils Relative %: 73 %
Platelets: 344 10*3/uL (ref 150–400)
RBC: 4.43 MIL/uL (ref 3.87–5.11)
RDW: 12.8 % (ref 11.5–15.5)
WBC: 14.2 10*3/uL — AB (ref 4.0–10.5)
nRBC: 0 % (ref 0.0–0.2)

## 2018-03-15 LAB — I-STAT CG4 LACTIC ACID, ED: Lactic Acid, Venous: 0.81 mmol/L (ref 0.5–1.9)

## 2018-03-15 MED ORDER — ENOXAPARIN SODIUM 40 MG/0.4ML ~~LOC~~ SOLN
40.0000 mg | SUBCUTANEOUS | Status: DC
  Administered 2018-03-15 – 2018-03-16 (×2): 40 mg via SUBCUTANEOUS
  Filled 2018-03-15 (×2): qty 0.4

## 2018-03-15 MED ORDER — ONDANSETRON HCL 4 MG PO TABS
4.0000 mg | ORAL_TABLET | Freq: Four times a day (QID) | ORAL | Status: DC | PRN
  Filled 2018-03-15: qty 1

## 2018-03-15 MED ORDER — SODIUM CHLORIDE 0.9 % IV SOLN
3.0000 g | Freq: Once | INTRAVENOUS | Status: AC
  Administered 2018-03-15: 3 g via INTRAVENOUS
  Filled 2018-03-15: qty 3

## 2018-03-15 MED ORDER — ACETAMINOPHEN 325 MG PO TABS
650.0000 mg | ORAL_TABLET | Freq: Four times a day (QID) | ORAL | Status: DC | PRN
  Administered 2018-03-15: 650 mg via ORAL
  Filled 2018-03-15: qty 2

## 2018-03-15 MED ORDER — VANCOMYCIN HCL IN DEXTROSE 1-5 GM/200ML-% IV SOLN: 1000.0000 mg | Freq: Once | INTRAVENOUS | Status: DC

## 2018-03-15 MED ORDER — INFLUENZA VAC SPLIT QUAD 0.5 ML IM SUSY: 0.5000 mL | PREFILLED_SYRINGE | INTRAMUSCULAR | Status: DC

## 2018-03-15 MED ORDER — IOHEXOL 300 MG/ML  SOLN
75.0000 mL | Freq: Once | INTRAMUSCULAR | Status: AC | PRN
  Administered 2018-03-15: 75 mL via INTRAVENOUS

## 2018-03-15 MED ORDER — ONDANSETRON HCL 4 MG/2ML IJ SOLN: 4.0000 mg | Freq: Four times a day (QID) | INTRAMUSCULAR | Status: DC | PRN

## 2018-03-15 MED ORDER — HYDROCODONE-ACETAMINOPHEN 5-325 MG PO TABS
1.0000 | ORAL_TABLET | ORAL | Status: DC | PRN
  Administered 2018-03-15 (×2): 2 via ORAL
  Filled 2018-03-15 (×2): qty 2

## 2018-03-15 MED ORDER — SODIUM CHLORIDE 0.9 % IV SOLN
INTRAVENOUS | Status: DC
  Administered 2018-03-15 – 2018-03-16 (×4): via INTRAVENOUS

## 2018-03-15 MED ORDER — FENTANYL CITRATE (PF) 100 MCG/2ML IJ SOLN
50.0000 ug | Freq: Once | INTRAMUSCULAR | Status: AC
  Administered 2018-03-15: 50 ug via INTRAVENOUS
  Filled 2018-03-15: qty 2

## 2018-03-15 MED ORDER — SODIUM CHLORIDE 0.9 % IV BOLUS
1000.0000 mL | Freq: Once | INTRAVENOUS | Status: AC
  Administered 2018-03-15: 1000 mL via INTRAVENOUS

## 2018-03-15 MED ORDER — VANCOMYCIN HCL 10 G IV SOLR
1250.0000 mg | Freq: Two times a day (BID) | INTRAVENOUS | Status: DC
  Administered 2018-03-15 – 2018-03-17 (×6): 1250 mg via INTRAVENOUS
  Filled 2018-03-15 (×7): qty 1250

## 2018-03-15 NOTE — H&P (Addendum)
History and Physical   Megan Gardner:[id number] DOB: 1965-05-18 DOA: 03/14/2018  Referring MD/NP/PA: Dr Lenard Lance  PCP: Shawnee Knapp, MD   Outpatient Specialists: None   Patient coming from: Home  Chief Complaint: Facial swelling  HPI: Megan Gardner is a 52 y.o. female with medical history significant of asthma, chronic back pain and vitamin D deficiency who presented with gradual worsening of swelling of the face with chills myalgia nasal dryness swollen lymph nodes that started for about a week.  Patient has not done anything except for noticing a pimples in her nose and she picked on it.  She denied any facial injury.  She denied any nausea or vomiting.  She has had fever in the beginning but no fever currently.  Came to the ER where she was seen and evaluated.  Initial impression was for facial cellulitis but also has widespread lymphadenopathy in the cervical ethmoid area of the face.  This was noted on CT scan.  Suspicion for possible reactive lymphadenopathy versus lymphoma is done at this point.  She is being admitted however for treatment of facial cellulitis..  ED Course: Temperature is 102.8 blood pressure 145/108 pulse 105 respiratory 23 oxygen sats of 97% on room air.  Sodium is 134 with glucose 120 white count 14.2 and platelet 344 hemoglobin 12.5.  She has a left shift.  CT soft tissue of the face showed findings suggestive of cellulitis with significant lymphadenopathy involving the right nasal cavity area.  Review of Systems: As per HPI otherwise 10 point review of systems negative.    Past Medical History:  Diagnosis Date  . Asthma   . Back pain   . Carpal tunnel syndrome    left wrist  . Fibroids   . Vitamin D deficiency     Past Surgical History:  Procedure Laterality Date  . MYOMECTOMY       reports that she has never smoked. She has never used smokeless tobacco. She reports that she drinks about 1.0 standard drinks of alcohol per week. She reports that she  does not use drugs.  No Known Allergies  Family History  Problem Relation Age of Onset  . Colon cancer Neg Hx   . Colon polyps Neg Hx   . Esophageal cancer Neg Hx   . Rectal cancer Neg Hx   . Stomach cancer Neg Hx      Prior to Admission medications   Medication Sig Start Date End Date Taking? Authorizing Provider  cholecalciferol (VITAMIN D) 1000 units tablet Take 1,000 Units by mouth daily.    [provider]  Multiple Vitamin (MULTIVITAMIN) tablet Take 1 tablet by mouth daily.    [provider]  naproxen (NAPROSYN) 500 MG tablet Take 1 tablet (500 mg total) by mouth 2 (two) times daily. 07/21/15   Lorayne Bender, PA-C    Physical Exam: Vitals:   03/15/18 0400 03/15/18 0415 03/15/18 0430 03/15/18 0521  BP: 138/89  138/89 115/82  Pulse: 99 89 100 (!) 105  Resp: 18 17  20   Temp:    (!) 102.8 F (39.3 C)  TempSrc:    Oral  SpO2: 98% 100% 98% 99%  Weight:    100.4 kg  Height:    5\' 5"  (1.651 m)      Constitutional: NAD, calm, comfortable Vitals:   03/15/18 0400 03/15/18 0415 03/15/18 0430 03/15/18 0521  BP: 138/89  138/89 115/82  Pulse: 99 89 100 (!) 105  Resp: 18 17  20  Temp:    (!) 102.8 F (39.3 C)  TempSrc:    Oral  SpO2: 98% 100% 98% 99%  Weight:    100.4 kg  Height:    5\' 5"  (1.651 m)   Eyes: PERRL, lids and conjunctivae normal, right eye slightly swollen in the peri-orbital area ENMT: Significant facial cellulitis on the right face involving the nasal area on the right.  Mucous membranes are moist. Posterior pharynx clear of any exudate or lesions.Normal dentition.  Neck: normal, supple, no masses, no thyromegaly Respiratory: clear to auscultation bilaterally, no wheezing, no crackles. Normal respiratory effort. No accessory muscle use.  Cardiovascular: Regular rate and rhythm, no murmurs / rubs / gallops. No extremity edema. 2+ pedal pulses. No carotid bruits.  Abdomen: no tenderness, no masses palpated. No hepatosplenomegaly. Bowel sounds  positive.  Musculoskeletal: no clubbing / cyanosis. No joint deformity upper and lower extremities. Good ROM, no contractures. Normal muscle tone.  Skin: no rashes, lesions, ulcers. No induration Neurologic: CN 2-12 grossly intact. Sensation intact, DTR normal. Strength 5/5 in all 4.  Psychiatric: Normal judgment and insight. Alert and oriented x 3. Normal mood.     Labs on Admission: I have personally reviewed following labs and imaging studies  CBC: Recent Labs  Lab 03/15/18 0032  WBC 14.2*  NEUTROABS 10.6*  HGB 12.5  HCT 38.7  MCV 87.4  PLT 193   Basic Metabolic Panel: Recent Labs  Lab 03/15/18 0032  NA 134*  K 4.0  CL 100  CO2 27  GLUCOSE 120*  BUN 7  CREATININE 0.98  CALCIUM 9.1   GFR: Estimated Creatinine Clearance: 78.9 mL/min (by C-G formula based on SCr of 0.98 mg/dL). Liver Function Tests: No results for input(s): AST, ALT, ALKPHOS, BILITOT, PROT, ALBUMIN in the last 168 hours. No results for input(s): LIPASE, AMYLASE in the last 168 hours. No results for input(s): AMMONIA in the last 168 hours. Coagulation Profile: No results for input(s): INR, PROTIME in the last 168 hours. Cardiac Enzymes: No results for input(s): CKTOTAL, CKMB, CKMBINDEX, TROPONINI in the last 168 hours. BNP (last 3 results) No results for input(s): PROBNP in the last 8760 hours. HbA1C: No results for input(s): HGBA1C in the last 72 hours. CBG: No results for input(s): GLUCAP in the last 168 hours. Lipid Profile: No results for input(s): CHOL, HDL, LDLCALC, TRIG, CHOLHDL, LDLDIRECT in the last 72 hours. Thyroid Function Tests: No results for input(s): TSH, T4TOTAL, FREET4, T3FREE, THYROIDAB in the last 72 hours. Anemia Panel: No results for input(s): VITAMINB12, FOLATE, FERRITIN, TIBC, IRON, RETICCTPCT in the last 72 hours. Urine analysis:    Component Value Date/Time   BILIRUBINUR negative 11/26/2017 1640   BILIRUBINUR small 10/14/2014 0959   KETONESUR negative 11/26/2017  1640   PROTEINUR negative 11/26/2017 1640   PROTEINUR neg 10/14/2014 0959   UROBILINOGEN 4.0 (A) 11/26/2017 1640   NITRITE Negative 11/26/2017 1640   NITRITE neg 10/14/2014 0959   LEUKOCYTESUR Negative 11/26/2017 1640   Sepsis Labs: @LABRCNTIP (procalcitonin:4,lacticidven:4) )No results found for this or any previous visit (from the past 240 hour(s)).   Radiological Exams on Admission: Ct Soft Tissue Neck W Contrast  Addendum Date: 03/15/2018   ADDENDUM REPORT: 03/15/2018 03:50 ADDENDUM: I spoke to Dr. Leonette Monarch at 3:42 AM on 03/15/2018. The patient has skin exam findings suggestive of cellulitis. On further review of the scan, there is mild subcutaneous fat induration surrounding the nose. In this context, the hyperenhancement of the right nasal cavity described above likely indicates an  infectious process. No osseous destruction within the nasal cavity. The degree of lymphadenopathy still seems out of proportion to an infectious process of the face or upper aerodigestive tract, particularly given the involvement of levels 5A/5B. Histologic sampling or repeat imaging following resolution of current infectious symptoms might be helpful. Electronically Signed   By: Ulyses Jarred M.D.   On: 03/15/2018 03:50   Result Date: 03/15/2018 CLINICAL DATA:  Facial swelling and neck pain EXAM: CT NECK WITH CONTRAST TECHNIQUE: Multidetector CT imaging of the neck was performed using the standard protocol following the bolus administration of intravenous contrast. CONTRAST:  58mL OMNIPAQUE IOHEXOL 300 MG/ML  SOLN COMPARISON:  None. FINDINGS: PHARYNX AND LARYNX: --Nasopharynx: The adenoid tonsils are enlarged. --Oral cavity and oropharynx: The palatine and lingual tonsils are mildly prominent. The visible oral cavity and floor of mouth are normal. --Hypopharynx: Normal vallecula and pyriform sinuses. --Larynx: Normal epiglottis and pre-epiglottic space. Normal aryepiglottic and vocal folds. --Retropharyngeal space:  No abscess, effusion or lymphadenopathy. SALIVARY GLANDS: --Parotid: No mass lesion or inflammation. No sialolithiasis or ductal dilatation. --Submandibular: Symmetric without inflammation. No sialolithiasis or ductal dilatation. --Sublingual: Normal. No ranula or other visible lesion of the base of tongue and floor of mouth. THYROID: Normal. LYMPH NODES: There is multilevel cervical lymphadenopathy. Right level 2A nodes measure up to 12 mm. Left level 5 a nodes measure up to 16 mm. Right level 5 B nodes measure 15 mm. VASCULAR: Major cervical vessels are patent. LIMITED INTRACRANIAL: Normal. VISUALIZED ORBITS: Normal. MASTOIDS AND VISUALIZED PARANASAL SINUSES: There is mucosal thickening within the right nasal cavity. Mastoids and paranasal sinuses are free of fluid. SKELETON: No bony spinal canal stenosis. No lytic or blastic lesions. UPPER CHEST: Clear. OTHER: None. IMPRESSION: Extensive multilevel bilateral cervical lymphadenopathy and diffuse tonsillar prominence, concerning for lymphoma. Infectious tonsillopharyngitis with reactive lymphadenopathy is less likely in this age group and typically does not cause such widespread and bilateral lymphadenopathy. Electronically Signed: By: Ulyses Jarred M.D. On: 03/15/2018 03:23    Assessment/Plan Principal Problem:   Facial cellulitis Active Problems:   Vitamin D deficiency   Transaminitis     #1 facial cellulitis: Patient will be admitted for IV antibiotics.  Initiate pain management.  Follow blood cultures as obtained in the ER.  May require lymph node biopsy later when stabilized.  If no significant improvement we may consider ENT consultation.  #2 history of vitamin D deficiency: Continue with replacement therapy in the hospital  #3 history of transaminitis: Liver function appears stable at the moment.  #4 history of carpal tunnel syndrome: Again no significant pain at the moment.   DVT prophylaxis: Lovenox Code Status: Full Family  Communication: No family currently available Disposition Plan: Home Consults called: None Admission status: Inpatient  Severity of Illness: The appropriate patient status for this patient is INPATIENT. Inpatient status is judged to be reasonable and necessary in order to provide the required intensity of service to ensure the patient's safety. The patient's presenting symptoms, physical exam findings, and initial radiographic and laboratory data in the context of their chronic comorbidities is felt to place them at high risk for further clinical deterioration. Furthermore, it is not anticipated that the patient will be medically stable for discharge from the hospital within 2 midnights of admission. The following factors support the patient status of inpatient.   " The patient's presenting symptoms include facial swelling and pain. " The worrisome physical exam findings include significant swelling of the right face with involvement of the right  nasal cavity. " The initial radiographic and laboratory data are worrisome because of significant lymphadenopathy with facial cellulitis. " The chronic co-morbidities include none.   * I certify that at the point of admission it is my clinical judgment that the patient will require inpatient hospital care spanning beyond 2 midnights from the point of admission due to high intensity of service, high risk for further deterioration and high frequency of surveillance required.Barbette Merino MD [facility] Pager [phone]  If 7PM-7AM, please contact night-coverage [email or web address] Password [facility]  03/15/2018, 5:58 AM

## 2018-03-15 NOTE — ED Provider Notes (Signed)
[facility] EMERGENCY DEPARTMENT Provider Note  CSN: [id number] Arrival date & time: 03/14/18 2357  Chief Complaint(s) Facial Swelling  HPI Megan Gardner is a 52 y.o. female here with 2 days of facial swelling  The history is provided by the patient.    Onset/Duration: 2 days Timing: gradual; worsening Location: nose  Quality: swelling and redness Severity: moderate Modifying Factors:  Improved by: nothing tried  Worsened by:  nothing Associated Signs/Symptoms:  Pertinent (+): chills, myalgia, nasal discharge, swollen lymph nodes that are sore (began last week)  Pertinent (-): fevers, headache, eye pain, cough, N/V/D, abd pain, chest pain or sob  Patient denies prior episodes; no prior staph infections  Past Medical History Past Medical History:  Diagnosis Date  . Asthma   . Back pain   . Carpal tunnel syndrome    left wrist  . Fibroids   . Vitamin D deficiency    Patient Active Problem List   Diagnosis Date Noted  . Facial cellulitis 03/15/2018  . Vitamin D deficiency 10/22/2014  . Carpal tunnel syndrome of left wrist 10/22/2014  . Transaminitis 10/22/2014   Home Medication(s) Prior to Admission medications   Medication Sig Start Date End Date Taking? Authorizing Provider  cholecalciferol (VITAMIN D) 1000 units tablet Take 1,000 Units by mouth daily.    [provider]  Multiple Vitamin (MULTIVITAMIN) tablet Take 1 tablet by mouth daily.    [provider]  naproxen (NAPROSYN) 500 MG tablet Take 1 tablet (500 mg total) by mouth 2 (two) times daily. 07/21/15   Lorayne Bender, PA-C                                                                                                                                    Past Surgical History Past Surgical History:  Procedure Laterality Date  . MYOMECTOMY     Family History Family History  Problem Relation Age of Onset  . Colon cancer Neg Hx   . Colon polyps Neg Hx   . Esophageal  cancer Neg Hx   . Rectal cancer Neg Hx   . Stomach cancer Neg Hx     Social History Social History   Tobacco Use  . Smoking status: Never Smoker  . Smokeless tobacco: Never Used  Substance Use Topics  . Alcohol use: Yes    Alcohol/week: 1.0 standard drinks    Types: 1 Glasses of wine per week    Comment: occassionally  . Drug use: No   Allergies Patient has no known allergies.  Review of Systems Review of Systems All other systems are reviewed and are negative for acute change except as noted in the HPI  Physical Exam Vital Signs  I have reviewed the triage vital signs BP (!) 141/83   Pulse 98   Temp 98.9 F (37.2 C) (Oral)   Resp (!) 23   Ht 5\' 5"  (1.651 m)  Wt 99.8 kg   SpO2 99%   BMI 36.61 kg/m   Physical Exam  Constitutional: She is oriented to person, place, and time. She appears well-developed and well-nourished. No distress.  HENT:  Head: Normocephalic and atraumatic.    Nose: Mucosal edema present. Right sinus exhibits no maxillary sinus tenderness and no frontal sinus tenderness. Left sinus exhibits no maxillary sinus tenderness and no frontal sinus tenderness.  Erythema and swelling to the nose and perinasal area as well as the intranasal mucosa right greater than left.  Eyes: Pupils are equal, round, and reactive to light. Conjunctivae and EOM are normal. Right eye exhibits no discharge. Left eye exhibits no discharge. No scleral icterus.  Neck: Normal range of motion. Neck supple.  Cardiovascular: Normal rate and regular rhythm. Exam reveals no gallop and no friction rub.  No murmur heard. Pulmonary/Chest: Effort normal and breath sounds normal. No stridor. No respiratory distress. She has no rales.  Abdominal: Soft. She exhibits no distension. There is no tenderness.  Musculoskeletal: She exhibits no edema or tenderness.  Lymphadenopathy:    She has cervical adenopathy.       Right cervical: Superficial cervical and deep cervical adenopathy  present.       Left cervical: Superficial cervical and deep cervical adenopathy present.  Neurological: She is alert and oriented to person, place, and time.  Skin: Skin is warm and dry. No rash noted. She is not diaphoretic. No erythema.  Psychiatric: She has a normal mood and affect.  Vitals reviewed.   ED Results and Treatments Labs (all labs ordered are listed, but only abnormal results are displayed) Labs Reviewed  CBC WITH DIFFERENTIAL/PLATELET - Abnormal; Notable for the following components:      Result Value   WBC 14.2 (*)    Neutro Abs 10.6 (*)    Monocytes Absolute 1.2 (*)    Abs Immature Granulocytes 0.11 (*)    All other components within normal limits  BASIC METABOLIC PANEL - Abnormal; Notable for the following components:   Sodium 134 (*)    Glucose, Bld 120 (*)    All other components within normal limits  I-STAT CG4 LACTIC ACID, ED                                                                                                                         EKG  EKG Interpretation  Date/Time:    Ventricular Rate:    PR Interval:    QRS Duration:   QT Interval:    QTC Calculation:   R Axis:     Text Interpretation:        Radiology Ct Soft Tissue Neck W Contrast  Addendum Date: 03/15/2018   ADDENDUM REPORT: 03/15/2018 03:50 ADDENDUM: I spoke to Dr. Leonette Monarch at 3:42 AM on 03/15/2018. The patient has skin exam findings suggestive of cellulitis. On further review of the scan, there is mild subcutaneous fat induration surrounding the nose. In this context, the hyperenhancement of the right  nasal cavity described above likely indicates an infectious process. No osseous destruction within the nasal cavity. The degree of lymphadenopathy still seems out of proportion to an infectious process of the face or upper aerodigestive tract, particularly given the involvement of levels 5A/5B. Histologic sampling or repeat imaging following resolution of current infectious symptoms  might be helpful. Electronically Signed   By: Ulyses Jarred M.D.   On: 03/15/2018 03:50   Result Date: 03/15/2018 CLINICAL DATA:  Facial swelling and neck pain EXAM: CT NECK WITH CONTRAST TECHNIQUE: Multidetector CT imaging of the neck was performed using the standard protocol following the bolus administration of intravenous contrast. CONTRAST:  54mL OMNIPAQUE IOHEXOL 300 MG/ML  SOLN COMPARISON:  None. FINDINGS: PHARYNX AND LARYNX: --Nasopharynx: The adenoid tonsils are enlarged. --Oral cavity and oropharynx: The palatine and lingual tonsils are mildly prominent. The visible oral cavity and floor of mouth are normal. --Hypopharynx: Normal vallecula and pyriform sinuses. --Larynx: Normal epiglottis and pre-epiglottic space. Normal aryepiglottic and vocal folds. --Retropharyngeal space: No abscess, effusion or lymphadenopathy. SALIVARY GLANDS: --Parotid: No mass lesion or inflammation. No sialolithiasis or ductal dilatation. --Submandibular: Symmetric without inflammation. No sialolithiasis or ductal dilatation. --Sublingual: Normal. No ranula or other visible lesion of the base of tongue and floor of mouth. THYROID: Normal. LYMPH NODES: There is multilevel cervical lymphadenopathy. Right level 2A nodes measure up to 12 mm. Left level 5 a nodes measure up to 16 mm. Right level 5 B nodes measure 15 mm. VASCULAR: Major cervical vessels are patent. LIMITED INTRACRANIAL: Normal. VISUALIZED ORBITS: Normal. MASTOIDS AND VISUALIZED PARANASAL SINUSES: There is mucosal thickening within the right nasal cavity. Mastoids and paranasal sinuses are free of fluid. SKELETON: No bony spinal canal stenosis. No lytic or blastic lesions. UPPER CHEST: Clear. OTHER: None. IMPRESSION: Extensive multilevel bilateral cervical lymphadenopathy and diffuse tonsillar prominence, concerning for lymphoma. Infectious tonsillopharyngitis with reactive lymphadenopathy is less likely in this age group and typically does not cause such widespread  and bilateral lymphadenopathy. Electronically Signed: By: Ulyses Jarred M.D. On: 03/15/2018 03:23   Pertinent labs & imaging results that were available during my care of the patient were reviewed by me and considered in my medical decision making (see chart for details).  Medications Ordered in ED Medications  Ampicillin-Sulbactam (UNASYN) 3 g in sodium chloride 0.9 % 100 mL IVPB (has no administration in time range)  sodium chloride 0.9 % bolus 1,000 mL (0 mLs Intravenous Stopped 03/15/18 0354)  fentaNYL (SUBLIMAZE) injection 50 mcg (50 mcg Intravenous Given 03/15/18 0145)  iohexol (OMNIPAQUE) 300 MG/ML solution 75 mL (75 mLs Intravenous Contrast Given 03/15/18 0201)                                                                                                                                    Procedures Procedures  (including critical care time)  Medical Decision Making / ED Course I have reviewed the nursing notes for this encounter  and the patient's prior records (if available in EHR or on provided paperwork).    Facial cellulitis versus erysipelas.  Patient is afebrile with stable vital signs.  Screening labs notable for leukocytosis with a left shift; no significant electrolyte derangements or renal insufficiency.  CT of the neck and face was obtained revealing diffused lymphadenopathy.   Possibly due to infection versus lymphoma. No abscess.   Started on antibiotics.  Case discussed with medicine who will admit the patient for continued work-up and management.  Final Clinical Impression(s) / ED Diagnoses Final diagnoses:  Facial cellulitis  LAD (lymphadenopathy)      This chart was dictated using voice recognition software.  Despite best efforts to proofread,  errors can occur which can change the documentation meaning.   Fatima Blank, MD 03/15/18 [id number]

## 2018-03-15 NOTE — Progress Notes (Signed)
PROGRESS NOTE  No charge note. Admitted after mdnight  Megan Gardner  ZDG:[id number] DOB: Dec 16, 1965 DOA: 03/14/2018 PCP: Shawnee Knapp, MD    Brief Narrative:  52 y.o. female with medical history significant of asthma, chronic back pain and vitamin D deficiency who presented with gradual worsening of swelling of the face with chills myalgia nasal dryness swollen lymph nodes that started for about a week.  Patient has not done anything except for noticing a pimples in her nose and she picked on it.  She denied any facial injury.  She denied any nausea or vomiting.  She has had fever in the beginning but no fever currently.  Came to the ER where she was seen and evaluated.  Initial impression was for facial cellulitis but also has widespread lymphadenopathy in the cervical ethmoid area of the face.  This was noted on CT scan.  Suspicion for possible reactive lymphadenopathy versus lymphoma is done at this point.  She is being admitted however for treatment of facial cellulitis..  ED Course: Temperature is 102.8 blood pressure 145/108 pulse 105 respiratory 23 oxygen sats of 97% on room air.  Sodium is 134 with glucose 120 white count 14.2 and platelet 344 hemoglobin 12.5.  She has a left shift.  CT soft tissue of the face showed findings suggestive of cellulitis with significant lymphadenopathy involving the right nasal cavity area.  Assessment & Plan:   Principal Problem:   Facial cellulitis Active Problems:   Vitamin D deficiency   Transaminitis  #1 facial cellulitis with sepsis present on admission:  -Presented with fever, tachycardia, leukocytosis in setting of facial cellulitis -remains febrile and clinically septic this AM -Head CT reviewed with findings c/w cellulitis -Given presenting sepsis, will continue IV vanc -Follow culture results  #2 history of vitamin D deficiency:  -Continue with replacement therapy in the hospital  #3 history of transaminitis:  -Liver function appears  stable at the moment.  #4 history of carpal tunnel syndrome:  -Stable at this time.  DVT prophylaxis: Lovenox subQ Code Status: Full Family Communication: Pt in room Disposition Plan: Uncertain at this time  Consultants:     Procedures:     Antimicrobials: Anti-infectives (From admission, onward)   Start     Dose/Rate Route Frequency Ordered Stop   03/15/18 0530  vancomycin (VANCOCIN) IVPB 1000 mg/200 mL premix  Status:  Discontinued     1,000 mg 200 mL/hr over 60 Minutes Intravenous  Once 03/15/18 0523 03/15/18 0533   03/15/18 0430  vancomycin (VANCOCIN) 1,250 mg in sodium chloride 0.9 % 250 mL IVPB     1,250 mg 166.7 mL/hr over 90 Minutes Intravenous Every 12 hours 03/15/18 0415     03/15/18 0345  Ampicillin-Sulbactam (UNASYN) 3 g in sodium chloride 0.9 % 100 mL IVPB     3 g 200 mL/hr over 30 Minutes Intravenous  Once 03/15/18 0339 03/15/18 0718       Subjective: Without complaints  Objective: Vitals:   03/15/18 0400 03/15/18 0415 03/15/18 0430 03/15/18 0521  BP: 138/89  138/89 115/82  Pulse: 99 89 100 (!) 105  Resp: 18 17  20   Temp:    (!) 102.8 F (39.3 C)  TempSrc:    Oral  SpO2: 98% 100% 98% 99%  Weight:    100.4 kg  Height:    5\' 5"  (1.651 m)    Intake/Output Summary (Last 24 hours) at 03/15/2018 0900 Last data filed at 03/15/2018 0600 Gross per 24 hour  Intake 1281.49  ml  Output -  Net 1281.49 ml   Filed Weights   03/15/18 0011 03/15/18 0521  Weight: 99.8 kg 100.4 kg    Examination:  General exam: Appears calm and comfortable  Respiratory system: Clear to auscultation. Respiratory effort normal. Cardiovascular system: S1 & S2 heard, RRR Gastrointestinal system: Abdomen is nondistended, soft and nontender. No organomegaly or masses felt. Normal bowel sounds heard. Central nervous system: Alert and oriented. No focal neurological deficits. Extremities: Symmetric 5 x 5 power. Skin: erythematous patch over anterior of face Psychiatry:  Judgement and insight appear normal. Mood & affect appropriate.   Data Reviewed: I have personally reviewed following labs and imaging studies  CBC: Recent Labs  Lab 03/15/18 0032  WBC 14.2*  NEUTROABS 10.6*  HGB 12.5  HCT 38.7  MCV 87.4  PLT 937   Basic Metabolic Panel: Recent Labs  Lab 03/15/18 0032  NA 134*  K 4.0  CL 100  CO2 27  GLUCOSE 120*  BUN 7  CREATININE 0.98  CALCIUM 9.1   GFR: Estimated Creatinine Clearance: 78.9 mL/min (by C-G formula based on SCr of 0.98 mg/dL). Liver Function Tests: No results for input(s): AST, ALT, ALKPHOS, BILITOT, PROT, ALBUMIN in the last 168 hours. No results for input(s): LIPASE, AMYLASE in the last 168 hours. No results for input(s): AMMONIA in the last 168 hours. Coagulation Profile: No results for input(s): INR, PROTIME in the last 168 hours. Cardiac Enzymes: No results for input(s): CKTOTAL, CKMB, CKMBINDEX, TROPONINI in the last 168 hours. BNP (last 3 results) No results for input(s): PROBNP in the last 8760 hours. HbA1C: No results for input(s): HGBA1C in the last 72 hours. CBG: No results for input(s): GLUCAP in the last 168 hours. Lipid Profile: No results for input(s): CHOL, HDL, LDLCALC, TRIG, CHOLHDL, LDLDIRECT in the last 72 hours. Thyroid Function Tests: No results for input(s): TSH, T4TOTAL, FREET4, T3FREE, THYROIDAB in the last 72 hours. Anemia Panel: No results for input(s): VITAMINB12, FOLATE, FERRITIN, TIBC, IRON, RETICCTPCT in the last 72 hours. Sepsis Labs: Recent Labs  Lab 03/15/18 0050  LATICACIDVEN 0.81    No results found for this or any previous visit (from the past 240 hour(s)).   Radiology Studies: Ct Soft Tissue Neck W Contrast  Addendum Date: 03/15/2018   ADDENDUM REPORT: 03/15/2018 03:50 ADDENDUM: I spoke to Dr. Leonette Monarch at 3:42 AM on 03/15/2018. The patient has skin exam findings suggestive of cellulitis. On further review of the scan, there is mild subcutaneous fat induration  surrounding the nose. In this context, the hyperenhancement of the right nasal cavity described above likely indicates an infectious process. No osseous destruction within the nasal cavity. The degree of lymphadenopathy still seems out of proportion to an infectious process of the face or upper aerodigestive tract, particularly given the involvement of levels 5A/5B. Histologic sampling or repeat imaging following resolution of current infectious symptoms might be helpful. Electronically Signed   By: Ulyses Jarred M.D.   On: 03/15/2018 03:50   Result Date: 03/15/2018 CLINICAL DATA:  Facial swelling and neck pain EXAM: CT NECK WITH CONTRAST TECHNIQUE: Multidetector CT imaging of the neck was performed using the standard protocol following the bolus administration of intravenous contrast. CONTRAST:  10mL OMNIPAQUE IOHEXOL 300 MG/ML  SOLN COMPARISON:  None. FINDINGS: PHARYNX AND LARYNX: --Nasopharynx: The adenoid tonsils are enlarged. --Oral cavity and oropharynx: The palatine and lingual tonsils are mildly prominent. The visible oral cavity and floor of mouth are normal. --Hypopharynx: Normal vallecula and pyriform sinuses. --Larynx: Normal  epiglottis and pre-epiglottic space. Normal aryepiglottic and vocal folds. --Retropharyngeal space: No abscess, effusion or lymphadenopathy. SALIVARY GLANDS: --Parotid: No mass lesion or inflammation. No sialolithiasis or ductal dilatation. --Submandibular: Symmetric without inflammation. No sialolithiasis or ductal dilatation. --Sublingual: Normal. No ranula or other visible lesion of the base of tongue and floor of mouth. THYROID: Normal. LYMPH NODES: There is multilevel cervical lymphadenopathy. Right level 2A nodes measure up to 12 mm. Left level 5 a nodes measure up to 16 mm. Right level 5 B nodes measure 15 mm. VASCULAR: Major cervical vessels are patent. LIMITED INTRACRANIAL: Normal. VISUALIZED ORBITS: Normal. MASTOIDS AND VISUALIZED PARANASAL SINUSES: There is mucosal  thickening within the right nasal cavity. Mastoids and paranasal sinuses are free of fluid. SKELETON: No bony spinal canal stenosis. No lytic or blastic lesions. UPPER CHEST: Clear. OTHER: None. IMPRESSION: Extensive multilevel bilateral cervical lymphadenopathy and diffuse tonsillar prominence, concerning for lymphoma. Infectious tonsillopharyngitis with reactive lymphadenopathy is less likely in this age group and typically does not cause such widespread and bilateral lymphadenopathy. Electronically Signed: By: Ulyses Jarred M.D. On: 03/15/2018 03:23    Scheduled Meds: . enoxaparin (LOVENOX) injection  40 mg Subcutaneous Q24H   Continuous Infusions: . sodium chloride 100 mL/hr at 03/15/18 0545  . vancomycin Stopped (03/15/18 0645)     LOS: 0 days   Marylu Lund, MD [facility] Pager On Amion  If 7PM-7AM, please contact night-coverage 03/15/2018, 9:00 AM

## 2018-03-15 NOTE — Progress Notes (Signed)
Pharmacy Antibiotic Note  Megan Gardner is a 52 y.o. female admitted on 03/14/2018 with cellulitis.  Pharmacy has been consulted for Vancomycin dosing. Facial cellulitis. WBC mildly elevated. Renal function Ok.   Plan: Vancomycin 1250 mg IV q12h Trend WBC, temp, renal function  F/U infectious work-up Drug levels as indicated   Height: 5\' 5"  (165.1 cm) Weight: 220 lb (99.8 kg) IBW/kg (Calculated) : 57  Temp (24hrs), Avg:98.9 F (37.2 C), Min:98.9 F (37.2 C), Max:98.9 F (37.2 C)  Recent Labs  Lab 03/15/18 0032 03/15/18 0050  WBC 14.2*  --   CREATININE 0.98  --   LATICACIDVEN  --  0.81    Estimated Creatinine Clearance: 78.6 mL/min (by C-G formula based on SCr of 0.98 mg/dL).    No Known Allergies   Megan Gardner 03/15/2018 4:16 AM

## 2018-03-15 NOTE — Progress Notes (Addendum)
0520 received patient from ED, pain 3/10 but comfortable except for congestion in face and soreness in neck.  A&O x4, ambulated to bed. No family at bedside.  0981 patient temp 102.8, paged X. Blount for Tylenol. No new orders given.

## 2018-03-15 NOTE — ED Triage Notes (Signed)
Pt from home w/ a c/o feeling achy all over since last week. Pt took Tylenol which relieved most of her pain. Yesterday the pain came back. Today she noted facial swelling and lateral neck (both sides) pain and swelling. Erythema noted to her nose and cheeks. Yellow/green nasal drainage also present. Pt denies SOB or cough. She took Sudafed and Tylenol yesterday and today. She is not sure if she has ever had Sudafed before.

## 2018-03-16 DIAGNOSIS — E559 Vitamin D deficiency, unspecified: Secondary | ICD-10-CM

## 2018-03-16 DIAGNOSIS — R740 Nonspecific elevation of levels of transaminase and lactic acid dehydrogenase [LDH]: Secondary | ICD-10-CM

## 2018-03-16 DIAGNOSIS — R591 Generalized enlarged lymph nodes: Secondary | ICD-10-CM

## 2018-03-16 LAB — CBC
HCT: 34.9 % — ABNORMAL LOW (ref 36.0–46.0)
Hemoglobin: 10.8 g/dL — ABNORMAL LOW (ref 12.0–15.0)
MCH: 27 pg (ref 26.0–34.0)
MCHC: 30.9 g/dL (ref 30.0–36.0)
MCV: 87.3 fL (ref 80.0–100.0)
PLATELETS: 325 10*3/uL (ref 150–400)
RBC: 4 MIL/uL (ref 3.87–5.11)
RDW: 13.2 % (ref 11.5–15.5)
WBC: 14.4 10*3/uL — ABNORMAL HIGH (ref 4.0–10.5)
nRBC: 0 % (ref 0.0–0.2)

## 2018-03-16 LAB — BASIC METABOLIC PANEL
Anion gap: 4 — ABNORMAL LOW (ref 5–15)
BUN: 8 mg/dL (ref 6–20)
CALCIUM: 8.4 mg/dL — AB (ref 8.9–10.3)
CO2: 26 mmol/L (ref 22–32)
CREATININE: 0.86 mg/dL (ref 0.44–1.00)
Chloride: 106 mmol/L (ref 98–111)
GFR calc Af Amer: >60 mL/min (ref >60)
GFR calc non Af Amer: >60 mL/min (ref >60)
GLUCOSE: 102 mg/dL — AB (ref 70–99)
Potassium: 3.8 mmol/L (ref 3.5–5.1)
Sodium: 136 mmol/L (ref 135–145)

## 2018-03-16 MED ORDER — HYDROCORTISONE 2.5 % RE CREA
TOPICAL_CREAM | Freq: Three times a day (TID) | RECTAL | Status: DC
  Administered 2018-03-16: 1 via RECTAL
  Administered 2018-03-17: 10:00:00 via RECTAL
  Filled 2018-03-16: qty 28.35

## 2018-03-16 MED ORDER — DOCUSATE SODIUM 100 MG PO CAPS
100.0000 mg | ORAL_CAPSULE | Freq: Every day | ORAL | Status: DC
  Administered 2018-03-16 – 2018-03-17 (×2): 100 mg via ORAL
  Filled 2018-03-16 (×2): qty 1

## 2018-03-16 NOTE — Progress Notes (Signed)
Pt c/o increased swelling/edema in her face under her eyes.  No c/o pain, but is uncomfortable and feels tightness.  MD notified.  Will continue to monitor.  Megan Gardner

## 2018-03-16 NOTE — Progress Notes (Signed)
PROGRESS NOTE  No charge note. Admitted after mdnight  Megan Gardner  XNA:[id number] DOB: Jan 01, 1966 DOA: 03/14/2018 PCP: Shawnee Knapp, MD    Brief Narrative:  52 y.o. female with medical history significant of asthma, chronic back pain and vitamin D deficiency who presented with gradual worsening of swelling of the face with chills myalgia nasal dryness swollen lymph nodes that started for about a week.  Patient has not done anything except for noticing a pimples in her nose and she picked on it.  She denied any facial injury.  She denied any nausea or vomiting.  She has had fever in the beginning but no fever currently.  Came to the ER where she was seen and evaluated.  Initial impression was for facial cellulitis but also has widespread lymphadenopathy in the cervical ethmoid area of the face.  This was noted on CT scan.  Suspicion for possible reactive lymphadenopathy versus lymphoma is done at this point.  She is being admitted however for treatment of facial cellulitis..  ED Course: Temperature is 102.8 blood pressure 145/108 pulse 105 respiratory 23 oxygen sats of 97% on room air.  Sodium is 134 with glucose 120 white count 14.2 and platelet 344 hemoglobin 12.5.  She has a left shift.  CT soft tissue of the face showed findings suggestive of cellulitis with significant lymphadenopathy involving the right nasal cavity area.  Assessment & Plan:   Principal Problem:   Facial cellulitis Active Problems:   Vitamin D deficiency   Transaminitis  #1 facial cellulitis with sepsis present on admission:  -Presented with fever, tachycardia, leukocytosis in setting of facial cellulitis -remains febrile and clinically septic at time of presentation -Head CT reviewed with findings c/w cellulitis -Given presenting sepsis, will continue IV vanc -Fevers resolved. No longer septic -Still with increased facial swelling. Discussed case with ENT. Recommendations for ice packs as needed, otherwise ENT  agrees with current plan per above  #2 history of vitamin D deficiency:  -Continue with replacement therapy in the hospital -Stable at present  #3 history of transaminitis:  -Liver function appears stable at the moment. -Stable  #4 history of carpal tunnel syndrome:  -Presently stable  #5 Hemorrhoids -Will start colace -Will give trial of hemorrhoidal cream  DVT prophylaxis: Lovenox subQ Code Status: Full Family Communication: Pt in room Disposition Plan: Uncertain at this time  Consultants:     Procedures:     Antimicrobials: Anti-infectives (From admission, onward)   Start     Dose/Rate Route Frequency Ordered Stop   03/15/18 0530  vancomycin (VANCOCIN) IVPB 1000 mg/200 mL premix  Status:  Discontinued     1,000 mg 200 mL/hr over 60 Minutes Intravenous  Once 03/15/18 0523 03/15/18 0533   03/15/18 0430  vancomycin (VANCOCIN) 1,250 mg in sodium chloride 0.9 % 250 mL IVPB     1,250 mg 166.7 mL/hr over 90 Minutes Intravenous Every 12 hours 03/15/18 0415     03/15/18 0345  Ampicillin-Sulbactam (UNASYN) 3 g in sodium chloride 0.9 % 100 mL IVPB     3 g 200 mL/hr over 30 Minutes Intravenous  Once 03/15/18 0339 03/15/18 0718      Subjective: Complaining of continued facial swelling  Objective: Vitals:   03/15/18 1411 03/15/18 2213 03/16/18 0613 03/16/18 1416  BP: 129/85 (!) 140/93 (!) 152/99 (!) 151/91  Pulse: (!) 101 85 80 84  Resp: 18 18 18 19   Temp: 98.6 F (37 C) 98.3 F (36.8 C) 98.6 F (37 C) 98.7 F (  37.1 C)  TempSrc: Oral Oral Oral Oral  SpO2: 100% 98% 98% 100%  Weight:      Height:        Intake/Output Summary (Last 24 hours) at 03/16/2018 1810 Last data filed at 03/16/2018 0900 Gross per 24 hour  Intake 2895.88 ml  Output -  Net 2895.88 ml   Filed Weights   03/15/18 0011 03/15/18 0521  Weight: 99.8 kg 100.4 kg    Examination: General exam: Awake, laying in bed, in nad Respiratory system: Normal respiratory effort, no  wheezing Cardiovascular system: regular rate, s1, s2 Gastrointestinal system: Soft, nondistended, positive BS Central nervous system: CN2-12 grossly intact, strength intact Extremities: Perfused, no clubbing Skin: Normal skin turgor, facial swelling over B cheeks Psychiatry: Mood normal // no visual hallucinations   Data Reviewed: I have personally reviewed following labs and imaging studies  CBC: Recent Labs  Lab 03/15/18 0032 03/16/18 0442  WBC 14.2* 14.4*  NEUTROABS 10.6*  --   HGB 12.5 10.8*  HCT 38.7 34.9*  MCV 87.4 87.3  PLT 344 353   Basic Metabolic Panel: Recent Labs  Lab 03/15/18 0032 03/16/18 0442  NA 134* 136  K 4.0 3.8  CL 100 106  CO2 27 26  GLUCOSE 120* 102*  BUN 7 8  CREATININE 0.98 0.86  CALCIUM 9.1 8.4*   GFR: Estimated Creatinine Clearance: 89.9 mL/min (by C-G formula based on SCr of 0.86 mg/dL). Liver Function Tests: No results for input(s): AST, ALT, ALKPHOS, BILITOT, PROT, ALBUMIN in the last 168 hours. No results for input(s): LIPASE, AMYLASE in the last 168 hours. No results for input(s): AMMONIA in the last 168 hours. Coagulation Profile: No results for input(s): INR, PROTIME in the last 168 hours. Cardiac Enzymes: No results for input(s): CKTOTAL, CKMB, CKMBINDEX, TROPONINI in the last 168 hours. BNP (last 3 results) No results for input(s): PROBNP in the last 8760 hours. HbA1C: No results for input(s): HGBA1C in the last 72 hours. CBG: No results for input(s): GLUCAP in the last 168 hours. Lipid Profile: No results for input(s): CHOL, HDL, LDLCALC, TRIG, CHOLHDL, LDLDIRECT in the last 72 hours. Thyroid Function Tests: No results for input(s): TSH, T4TOTAL, FREET4, T3FREE, THYROIDAB in the last 72 hours. Anemia Panel: No results for input(s): VITAMINB12, FOLATE, FERRITIN, TIBC, IRON, RETICCTPCT in the last 72 hours. Sepsis Labs: Recent Labs  Lab 03/15/18 0050  LATICACIDVEN 0.81    No results found for this or any previous visit  (from the past 240 hour(s)).   Radiology Studies: Ct Soft Tissue Neck W Contrast  Addendum Date: 03/15/2018   ADDENDUM REPORT: 03/15/2018 03:50 ADDENDUM: I spoke to Dr. Leonette Monarch at 3:42 AM on 03/15/2018. The patient has skin exam findings suggestive of cellulitis. On further review of the scan, there is mild subcutaneous fat induration surrounding the nose. In this context, the hyperenhancement of the right nasal cavity described above likely indicates an infectious process. No osseous destruction within the nasal cavity. The degree of lymphadenopathy still seems out of proportion to an infectious process of the face or upper aerodigestive tract, particularly given the involvement of levels 5A/5B. Histologic sampling or repeat imaging following resolution of current infectious symptoms might be helpful. Electronically Signed   By: Ulyses Jarred M.D.   On: 03/15/2018 03:50   Result Date: 03/15/2018 CLINICAL DATA:  Facial swelling and neck pain EXAM: CT NECK WITH CONTRAST TECHNIQUE: Multidetector CT imaging of the neck was performed using the standard protocol following the bolus administration of intravenous contrast.  CONTRAST:  40mL OMNIPAQUE IOHEXOL 300 MG/ML  SOLN COMPARISON:  None. FINDINGS: PHARYNX AND LARYNX: --Nasopharynx: The adenoid tonsils are enlarged. --Oral cavity and oropharynx: The palatine and lingual tonsils are mildly prominent. The visible oral cavity and floor of mouth are normal. --Hypopharynx: Normal vallecula and pyriform sinuses. --Larynx: Normal epiglottis and pre-epiglottic space. Normal aryepiglottic and vocal folds. --Retropharyngeal space: No abscess, effusion or lymphadenopathy. SALIVARY GLANDS: --Parotid: No mass lesion or inflammation. No sialolithiasis or ductal dilatation. --Submandibular: Symmetric without inflammation. No sialolithiasis or ductal dilatation. --Sublingual: Normal. No ranula or other visible lesion of the base of tongue and floor of mouth. THYROID: Normal.  LYMPH NODES: There is multilevel cervical lymphadenopathy. Right level 2A nodes measure up to 12 mm. Left level 5 a nodes measure up to 16 mm. Right level 5 B nodes measure 15 mm. VASCULAR: Major cervical vessels are patent. LIMITED INTRACRANIAL: Normal. VISUALIZED ORBITS: Normal. MASTOIDS AND VISUALIZED PARANASAL SINUSES: There is mucosal thickening within the right nasal cavity. Mastoids and paranasal sinuses are free of fluid. SKELETON: No bony spinal canal stenosis. No lytic or blastic lesions. UPPER CHEST: Clear. OTHER: None. IMPRESSION: Extensive multilevel bilateral cervical lymphadenopathy and diffuse tonsillar prominence, concerning for lymphoma. Infectious tonsillopharyngitis with reactive lymphadenopathy is less likely in this age group and typically does not cause such widespread and bilateral lymphadenopathy. Electronically Signed: By: Ulyses Jarred M.D. On: 03/15/2018 03:23    Scheduled Meds: . enoxaparin (LOVENOX) injection  40 mg Subcutaneous Q24H  . Influenza vac split quadrivalent PF  0.5 mL Intramuscular Tomorrow-1000   Continuous Infusions: . sodium chloride 75 mL/hr at 03/16/18 0622  . vancomycin 1,250 mg (03/16/18 1707)     LOS: 1 day   Marylu Lund, MD [facility] Pager On Amion  If 7PM-7AM, please contact night-coverage 03/16/2018, 6:10 PM

## 2018-03-17 LAB — CBC
HEMATOCRIT: 34.2 % — AB (ref 36.0–46.0)
HEMOGLOBIN: 10.7 g/dL — AB (ref 12.0–15.0)
MCH: 27.1 pg (ref 26.0–34.0)
MCHC: 31.3 g/dL (ref 30.0–36.0)
MCV: 86.6 fL (ref 80.0–100.0)
NRBC: 0 % (ref 0.0–0.2)
Platelets: 365 10*3/uL (ref 150–400)
RBC: 3.95 MIL/uL (ref 3.87–5.11)
RDW: 13.2 % (ref 11.5–15.5)
WBC: 13.8 10*3/uL — ABNORMAL HIGH (ref 4.0–10.5)

## 2018-03-17 MED ORDER — CEPHALEXIN 500 MG PO CAPS: 500.0000 mg | ORAL_CAPSULE | Freq: Four times a day (QID) | ORAL | 0 refills | Status: AC

## 2018-03-17 MED ORDER — HYDROCODONE-ACETAMINOPHEN 5-325 MG PO TABS: 1.0000 | ORAL_TABLET | Freq: Four times a day (QID) | ORAL | 0 refills | Status: DC | PRN

## 2018-03-17 NOTE — Discharge Summary (Signed)
Physician Discharge Summary  Megan Gardner DDU:[id number] DOB: [birth year] DOA: 03/14/2018  PCP: Shawnee Knapp, MD  Admit date: 03/14/2018 Discharge date: 03/17/2018  Admitted From: Home Disposition:  Home  Recommendations for Outpatient Follow-up:  1. Follow up with PCP in 1-2 weeks  [place] reviewed. No recent controlled substances are listed. Will provide a limited quantity of vicodin for pain relief  Discharge Condition:Improved CODE STATUS:Full Diet recommendation: Regular   Brief/Interim Summary: 52 y.o.femalewith medical history significant ofasthma, chronic back pain and vitamin D deficiency who presented with gradual worsening of swelling of the face with chills myalgia nasal dryness swollen lymph nodes that started for about a week. Patient has not done anything except for noticing a pimples in her nose and she picked on it. She denied any facial injury. She denied any nausea or vomiting. She has had fever in the beginning but no fever currently. Came to the ER where she was seen and evaluated. Initial impression was for facial cellulitis but also has widespread lymphadenopathy in the cervical ethmoid area of the face. This was noted on CT scan. Suspicion for possible reactive lymphadenopathy versus lymphoma is done at this point. She is being admitted however for treatment of facial cellulitis..  ED Course:Temperature is 102.8 blood pressure 145/108 pulse 105 respiratory 23 oxygen sats of 97% on room air. Sodium is 134 with glucose 120 white count 14.2 and platelet 344 hemoglobin 12.5. She has a left shift. CT soft tissue of the face showed findings suggestive of cellulitis with significant lymphadenopathy involving the right nasal cavity area.  Discharge Diagnoses:  Principal Problem:   Facial cellulitis Active Problems:   Vitamin D deficiency   Transaminitis   #1 facial cellulitis with sepsis present on admission: -Presented with fever, tachycardia,  leukocytosis in setting of facial cellulitis -remains febrile and clinically septic at time of presentation -Head CT reviewed with findings c/w cellulitis -Given presenting sepsis, will continue IV vanc -Fevers resolved. No longer septic -Facial swelling now improving. Discussed case with ENT. Recommendations for ice packs as needed, otherwise ENT agrees with current plan per above -Will complete course of abx with keflex on d/c  #2 history of vitamin D deficiency:  -Continue with replacement therapy in the hospital -Stable at present  #3 history of transaminitis: -Liver function appears stable at the moment. -Stable  #4 history of carpal tunnel syndrome:  -Presently stable  #5 Hemorrhoids -Given trial of hemorrhoidal cream   Discharge Instructions   Allergies as of 03/17/2018   No Known Allergies     Medication List    TAKE these medications   cephALEXin 500 MG capsule Commonly known as:  KEFLEX Take 1 capsule (500 mg total) by mouth 4 (four) times daily for 3 days.   cholecalciferol 1000 units tablet Commonly known as:  VITAMIN D Take 1,000 Units by mouth daily.   HYDROcodone-acetaminophen 5-325 MG tablet Commonly known as:  NORCO/VICODIN Take 1-2 tablets by mouth every 6 (six) hours as needed for moderate pain.   multivitamin tablet Take 1 tablet by mouth daily.      Follow-up Information    Shawnee Knapp, MD. Schedule an appointment as soon as possible for a visit in 2 week(s).   Specialty:  Family Medicine Contact information: [place] [address] [phone]          No Known Allergies  Consultations:  Discussed case with ENT  Procedures/Studies: Ct Soft Tissue Neck W Contrast  Addendum Date: 03/15/2018   ADDENDUM  REPORT: 03/15/2018 03:50 ADDENDUM: I spoke to Dr. Leonette Monarch at 3:42 AM on 03/15/2018. The patient has skin exam findings suggestive of cellulitis. On further review of the scan, there is mild subcutaneous fat  induration surrounding the nose. In this context, the hyperenhancement of the right nasal cavity described above likely indicates an infectious process. No osseous destruction within the nasal cavity. The degree of lymphadenopathy still seems out of proportion to an infectious process of the face or upper aerodigestive tract, particularly given the involvement of levels 5A/5B. Histologic sampling or repeat imaging following resolution of current infectious symptoms might be helpful. Electronically Signed   By: Ulyses Jarred M.D.   On: 03/15/2018 03:50   Result Date: 03/15/2018 CLINICAL DATA:  Facial swelling and neck pain EXAM: CT NECK WITH CONTRAST TECHNIQUE: Multidetector CT imaging of the neck was performed using the standard protocol following the bolus administration of intravenous contrast. CONTRAST:  35mL OMNIPAQUE IOHEXOL 300 MG/ML  SOLN COMPARISON:  None. FINDINGS: PHARYNX AND LARYNX: --Nasopharynx: The adenoid tonsils are enlarged. --Oral cavity and oropharynx: The palatine and lingual tonsils are mildly prominent. The visible oral cavity and floor of mouth are normal. --Hypopharynx: Normal vallecula and pyriform sinuses. --Larynx: Normal epiglottis and pre-epiglottic space. Normal aryepiglottic and vocal folds. --Retropharyngeal space: No abscess, effusion or lymphadenopathy. SALIVARY GLANDS: --Parotid: No mass lesion or inflammation. No sialolithiasis or ductal dilatation. --Submandibular: Symmetric without inflammation. No sialolithiasis or ductal dilatation. --Sublingual: Normal. No ranula or other visible lesion of the base of tongue and floor of mouth. THYROID: Normal. LYMPH NODES: There is multilevel cervical lymphadenopathy. Right level 2A nodes measure up to 12 mm. Left level 5 a nodes measure up to 16 mm. Right level 5 B nodes measure 15 mm. VASCULAR: Major cervical vessels are patent. LIMITED INTRACRANIAL: Normal. VISUALIZED ORBITS: Normal. MASTOIDS AND VISUALIZED PARANASAL SINUSES: There is  mucosal thickening within the right nasal cavity. Mastoids and paranasal sinuses are free of fluid. SKELETON: No bony spinal canal stenosis. No lytic or blastic lesions. UPPER CHEST: Clear. OTHER: None. IMPRESSION: Extensive multilevel bilateral cervical lymphadenopathy and diffuse tonsillar prominence, concerning for lymphoma. Infectious tonsillopharyngitis with reactive lymphadenopathy is less likely in this age group and typically does not cause such widespread and bilateral lymphadenopathy. Electronically Signed: By: Ulyses Jarred M.D. On: 03/15/2018 03:23     Subjective: Eager to go home  Discharge Exam: Vitals:   03/17/18 0559 03/17/18 1312  BP: (!) 153/97 (!) 138/94  Pulse: 75 76  Resp: 15 16  Temp: 98.2 F (36.8 C) 98.1 F (36.7 C)  SpO2: 99% 100%   Vitals:   03/16/18 1416 03/16/18 2118 03/17/18 0559 03/17/18 1312  BP: (!) 151/91 (!) 141/94 (!) 153/97 (!) 138/94  Pulse: 84 84 75 76  Resp: 19  15 16   Temp: 98.7 F (37.1 C) 99.1 F (37.3 C) 98.2 F (36.8 C) 98.1 F (36.7 C)  TempSrc: Oral  Oral Oral  SpO2: 100% 98% 99% 100%  Weight:      Height:        General: Pt is alert, awake, not in acute distress Cardiovascular: RRR, S1/S2 +, no rubs, no gallops Respiratory: CTA bilaterally, no wheezing, no rhonchi Abdominal: Soft, NT, ND, bowel sounds + Extremities: no edema, no cyanosis   The results of significant diagnostics from this hospitalization (including imaging, microbiology, ancillary and laboratory) are listed below for reference.     Microbiology: Recent Results (from the past 240 hour(s))  Culture, blood (routine x 2)  Status: None (Preliminary result)   Collection Time: 03/16/18  8:34 AM  Result Value Ref Range Status   Specimen Description BLOOD LEFT ANTECUBITAL  Final   Special Requests   Final    BOTTLES DRAWN AEROBIC ONLY Blood Culture adequate volume   Culture   Final    NO GROWTH < 24 HOURS Performed at [facility], [address] [place], [place] [address]    Report Status PENDING  Incomplete  Culture, blood (routine x 2)     Status: None (Preliminary result)   Collection Time: 03/16/18  8:40 AM  Result Value Ref Range Status   Specimen Description BLOOD RIGHT HAND  Final   Special Requests   Final    BOTTLES DRAWN AEROBIC ONLY Blood Culture adequate volume   Culture   Final    NO GROWTH < 24 HOURS Performed at [facility], [place] [address], [place], [place] [address]    Report Status PENDING  Incomplete     Labs: BNP (last 3 results) No results for input(s): BNP in the last 8760 hours. Basic Metabolic Panel: Recent Labs  Lab 03/15/18 0032 03/16/18 0442  NA 134* 136  K 4.0 3.8  CL 100 106  CO2 27 26  GLUCOSE 120* 102*  BUN 7 8  CREATININE 0.98 0.86  CALCIUM 9.1 8.4*   Liver Function Tests: No results for input(s): AST, ALT, ALKPHOS, BILITOT, PROT, ALBUMIN in the last 168 hours. No results for input(s): LIPASE, AMYLASE in the last 168 hours. No results for input(s): AMMONIA in the last 168 hours. CBC: Recent Labs  Lab 03/15/18 0032 03/16/18 0442 03/17/18 0252  WBC 14.2* 14.4* 13.8*  NEUTROABS 10.6*  --   --   HGB 12.5 10.8* 10.7*  HCT 38.7 34.9* 34.2*  MCV 87.4 87.3 86.6  PLT 344 325 365   Cardiac Enzymes: No results for input(s): CKTOTAL, CKMB, CKMBINDEX, TROPONINI in the last 168 hours. BNP: Invalid input(s): POCBNP CBG: No results for input(s): GLUCAP in the last 168 hours. D-Dimer No results for input(s): DDIMER in the last 72 hours. Hgb A1c No results for input(s): HGBA1C in the last 72 hours. Lipid Profile No results for input(s): CHOL, HDL, LDLCALC, TRIG, CHOLHDL, LDLDIRECT in the last 72 hours. Thyroid function studies No results for input(s): TSH, T4TOTAL, T3FREE, THYROIDAB in the last 72 hours.  Invalid input(s): FREET3 Anemia work up No results for input(s): VITAMINB12, FOLATE, FERRITIN, TIBC, IRON, RETICCTPCT in the last 72 hours. Urinalysis    Component  Value Date/Time   BILIRUBINUR negative 11/26/2017 1640   BILIRUBINUR small 10/14/2014 0959   KETONESUR negative 11/26/2017 1640   PROTEINUR negative 11/26/2017 1640   PROTEINUR neg 10/14/2014 0959   UROBILINOGEN 4.0 (A) 11/26/2017 1640   NITRITE Negative 11/26/2017 1640   NITRITE neg 10/14/2014 0959   LEUKOCYTESUR Negative 11/26/2017 1640   Sepsis Labs Invalid input(s): PROCALCITONIN,  WBC,  LACTICIDVEN Microbiology Recent Results (from the past 240 hour(s))  Culture, blood (routine x 2)     Status: None (Preliminary result)   Collection Time: 03/16/18  8:34 AM  Result Value Ref Range Status   Specimen Description BLOOD LEFT ANTECUBITAL  Final   Special Requests   Final    BOTTLES DRAWN AEROBIC ONLY Blood Culture adequate volume   Culture   Final    NO GROWTH < 24 HOURS Performed at [facility], [address], [place], [place] [address]    Report Status PENDING  Incomplete  Culture, blood (routine x 2)     Status: None (Preliminary result)   Collection Time: 03/16/18  8:40 AM  Result Value Ref Range Status   Specimen Description BLOOD RIGHT HAND  Final   Special Requests   Final    BOTTLES DRAWN AEROBIC ONLY Blood Culture adequate volume   Culture   Final    NO GROWTH < 24 HOURS Performed at [facility], [address], [place], [place] [address]    Report Status PENDING  Incomplete   Time spent: 30 min  SIGNED:   Marylu Lund, MD  [facility] 03/17/2018, 3:10 PM  If 7PM-7AM, please contact night-coverage

## 2018-03-21 LAB — CULTURE, BLOOD (ROUTINE X 2)
Culture: NO GROWTH
Culture: NO GROWTH
SPECIAL REQUESTS: ADEQUATE
SPECIAL REQUESTS: ADEQUATE

## 2018-03-23 ENCOUNTER — Telehealth: Payer: Self-pay | Admitting: Family Medicine

## 2018-03-23 NOTE — Telephone Encounter (Signed)
Pt came in today to try and schedule a hospital follow up with Brigitte Pulse.  I can't get her anything until January... Is there any way we can overbook?  She was in the hospital from Saturday (11/23) to Tuesday (11/26).  Please advise if we can do anything for her.  Thank you !!

## 2018-03-24 ENCOUNTER — Telehealth: Payer: Self-pay | Admitting: Family Medicine

## 2018-03-24 NOTE — Telephone Encounter (Signed)
Called and spoke with pt regarding needing to make an appt. I was able to get pt in with Dr. Brigitte Pulse on 04/17/18 at 2:00 pm. I advised pt of time, building and late policy. Pt acknowledged.

## 2018-04-17 ENCOUNTER — Ambulatory Visit: Payer: Managed Care, Other (non HMO) | Admitting: Family Medicine

## 2018-04-17 ENCOUNTER — Encounter: Payer: Self-pay | Admitting: Family Medicine

## 2018-04-17 VITALS — BP 141/92 | HR 67 | Temp 99.0°F | Resp 18 | Ht 65.0 in | Wt 224.2 lb

## 2018-04-17 DIAGNOSIS — L049 Acute lymphadenitis, unspecified: Secondary | ICD-10-CM

## 2018-04-17 DIAGNOSIS — R591 Generalized enlarged lymph nodes: Secondary | ICD-10-CM | POA: Diagnosis not present

## 2018-04-17 DIAGNOSIS — R7303 Prediabetes: Secondary | ICD-10-CM

## 2018-04-17 DIAGNOSIS — R221 Localized swelling, mass and lump, neck: Secondary | ICD-10-CM

## 2018-04-17 DIAGNOSIS — E559 Vitamin D deficiency, unspecified: Secondary | ICD-10-CM

## 2018-04-17 DIAGNOSIS — R740 Nonspecific elevation of levels of transaminase and lactic acid dehydrogenase [LDH]: Secondary | ICD-10-CM | POA: Diagnosis not present

## 2018-04-17 DIAGNOSIS — R7401 Elevation of levels of liver transaminase levels: Secondary | ICD-10-CM

## 2018-04-17 LAB — POCT CBC
Granulocyte percent: 56.8 %G (ref 37–80)
HCT, POC: 37.4 % (ref 29–41)
Hemoglobin: 12.7 g/dL (ref 11–14.6)
Lymph, poc: 3.2 (ref 0.6–3.4)
MCH, POC: 28.9 pg (ref 27–31.2)
MCHC: 33.9 g/dL (ref 31.8–35.4)
MCV: 85.3 fL (ref 76–111)
MID (CBC): 0.3 (ref 0–0.9)
MPV: 6.8 fL (ref 0–99.8)
PLATELET COUNT, POC: 319 10*3/uL (ref 142–424)
POC Granulocyte: 4.5 (ref 2–6.9)
POC LYMPH %: 39.9 % (ref 10–50)
POC MID %: 3.3 %M (ref 0–12)
RBC: 4.39 M/uL (ref 4.04–5.48)
RDW, POC: 15.1 %
WBC: 7.9 10*3/uL (ref 4.6–10.2)

## 2018-04-17 LAB — POCT GLYCOSYLATED HEMOGLOBIN (HGB A1C): Hemoglobin A1C: 5.7 % — AB (ref 4.0–5.6)

## 2018-04-17 MED ORDER — MUPIROCIN 2 % EX OINT: 1.0000 "application " | TOPICAL_OINTMENT | Freq: Two times a day (BID) | CUTANEOUS | 0 refills | Status: DC

## 2018-04-17 NOTE — Patient Instructions (Addendum)
If you have lab work done today you will be contacted with your lab results within the next 2 weeks.  If you have not heard from [place] then please contact us. The fastest way to get your results is to register for My Chart.   IF you received an x-ray today, you will receive an invoice from [facility]. Please contact [facility] at [phone] with questions or concerns regarding your invoice.   IF you received labwork today, you will receive an invoice from [facility]. Please contact [facility] at [phone] with questions or concerns regarding your invoice.   Our billing staff will not be able to assist you with questions regarding bills from these companies.  You will be contacted with the lab results as soon as they are available. The fastest way to get your results is to activate your My Chart account. Instructions are located on the last page of this paperwork. If you have not heard from [place] regarding the results in 2 weeks, please contact this office.      Community-Associated MRSA MRSA stands for methicillin-resistant Staphylococcus aureus. It is a type of infection that is caused by bacteria that no longer respond to common antibiotic medicines (drug-resistant bacteria). MRSA can cause an infection that is hard to treat. There are two types of MRSA infections:  Healthcare-associated (HA-MRSA) is an infection that you get during a stay in a hospital, rehabilitation facility, or nursing home.  Community-associated MRSA (CA-MRSA) is an infection that occurs without healthcare exposure. CA-MRSA may spread among sports teams, child care centers, and other similar settings, and most commonly affects the skin and other soft tissues. MRSA bacteria can be spread from person to person (is contagious). This usually happens when a healthy person touches objects that have the bacteria on them (are contaminated) or comes in direct skin-to-skin contact with an infected  person. What are the causes? Staphylococcus aureus bacteria normally live on the skin or in the nose of some people. This usually does not cause problems. However, a MRSA infection can happen if the bacteria enters the body through a cut, wound, or break in the skin. What increases the risk? The following factors may make you more likely to get this infection:  Close skin-to-skin contact with others.  Cuts and scratches that are not treated, not covered, or both.  Tattoos.  Recent antibiotic medicine use.  Sharing contaminated towels or clothes.  Having active skin conditions.  Participating in contact sports.  Living in crowded settings.  Homelessness.  IV drug use.  Sharing towels, razors, or sports equipment with other people. What are the signs or symptoms? Symptoms of this condition usually starts as a scratch or cut that becomes infected. Symptoms may include:  A pus-filled pimple.  A boil on your skin.  Pus draining from your skin.  A sore (abscess) under your skin or somewhere in your body.  Fever with or without chills. CA-MRSA infections are usually skin infections, but in some cases, severe illness may develop, such as:  Pneumonia.  Bone or joint infections.  Bloodstream infections (sepsis). Symptoms may vary as the infection gets worse. How is this diagnosed? This condition may be diagnosed by:  Taking a sample from the infected area and growing it in a lab (culture). The culture is checked under a microscope to see which type of antibiotic medicine will work to treat it (sensitivity test).  Testing bacteria samples for MRSA genes. This is faster than a culture. Your health  care provider may diagnose MRSA using samples from: ? Cuts or wounds in infected areas. ? Nasal swabs. ? Saliva or deep-cough specimens from the lungs (sputum). ? Urine. ? Blood. You may also have:  X-rays.  MRI.  CT scan.  Imaging studies to check if the infection has  spread to the lungs, bones, or joints. These may include X-rays, MRI, or CT scan.  A culture and sensitivity test of blood or fluids from inside the joints. How is this treated? Treatment varies and is based on how serious, deep, and widespread the infection is. Severe infections may require a hospital stay. Some skin infections, such as a small boil or sore (abscess), may be treated by draining pus from the site of the infection. More extensive surgery to drain pus may be necessary for deeper or more widespread soft tissue infections. After drainage, you may be prescribed antibiotics that are given by mouth (orally) or through a vein. You may start antibiotic treatment right away, or after having sensitivity testing to determine which antibiotic is best for you. Follow these instructions at home: Medicines   Take over-the-counter and prescription medicines only as told by your health care provider.  Take your antibiotic medicine as told by your health care provider. Do not stop taking the antibiotic even if you start to feel better. Lifestyle   Wash your hands often with soap and water. Ask anyone who lives with you to wash his or her hands often, too. If soap and water are not available, use hand sanitizer.  Do not use towels, razors, toothbrushes, bedding, or other items that will be used by others.  Avoid close contact with others as much as possible.  Wash towels, bedding, and clothes in the washing machine with detergent and hot water. Dry them in a hot dryer.  Always shower after exercising. General instructions  If you have a wound, follow instructions from your health care provider about how to take care of your wound. Make sure you: ? Wash your hands with soap and water before you change your bandage (dressing). If soap and water are not available, use hand sanitizer. ? Change your dressing as told by your health care provider, if you have one. ? Leave any stitches (sutures),  skin glue, or adhesive strips in place. These skin closures may need to be in place for 2 weeks or longer. If adhesive strip edges start to loosen and curl up, you may trim the loose edges. Do not remove adhesive strips completely unless your health care provider tells you to do that.  Tell all health care providers who care for you that you have MRSA.  Keep all follow-up visits as told by your health care provider. This is important. How is this prevented?  Only take antibiotics as prescribed by your health care provider, and only take them when absolutely necessary.  Wash your hands frequently with soap and water. If soap and water are not available, use an alcohol-based hand sanitizer. Dry your hands with a clean or disposable towel. Make sure that all people in your household wash their hands, too.  Wash and dry your clothes and bedding at the warmest temperatures that are recommended on the labels.  Maintain good hygiene by bathing often and keeping your body clean.  Clean wounds, cuts, and abrasions with soap and water and cover them with dry, germ-free (sterile) dressings until they heal.  If you have a wound that seems to be infected, ask your health care  provider if a culture should be done for MRSA and other bacteria.  If you are breastfeeding, talk to your health care provider about MRSA. You may be asked to temporarily stop breastfeeding. Contact a health care provider if:  Your infection seems to be getting worse. Signs may include: ? Warmth, redness, or tenderness around your wound site. ? A red line that spreads from your infection site. ? A dark color in the area around your infection. ? Wound drainage that is tan, yellow, or green. ? A bad smell coming from your wound.  You have a fever. Get help right away if:  You have trouble breathing.  You feel nauseous, you vomit, or you cannot take medicine without vomiting.  You have chest pain. Summary  MRSA stands for  methicillin-resistant Staphylococcus aureus. It is a type of infection that is caused by bacteria that no longer respond to common antibiotic medicines (drug-resistant bacteria).  MRSA can cause an infection that is hard to treat.  Treatment varies and is based on how serious, deep, and widespread the infection is. This information is not intended to replace advice given to you by your health care provider. Make sure you discuss any questions you have with your health care provider. Document Released: 07/12/2005 Document Revised: 07/15/2016 Document Reviewed: 05/08/2016 Elsevier Interactive Patient Education  2019 [employer].

## 2018-04-17 NOTE — Progress Notes (Signed)
Subjective:    Patient: Megan Gardner  DOB: 1965/11/23; 52 y.o.   MRN: [id number]  Chief Complaint  Patient presents with  . Hospitalization Follow-up  . Cellulitis    states her face is better; denies any pain or tenderness    HPI  Patient was hospitalized from 1124 through 1126 for facial cellulitis.  In the ER she was noted to have widespread lymphadenopathy in the cervical ethmoid area of her face which was also seen on CT scan and thought to be reactive lymphadenopathy versus lymphoma.  However she was treated with antibiotics for facial cellulitis as her WBCs were 14.2 with a left shift and CT of the soft tissue of her face did show significant lymphadenopathy in the right nasal cavity suggestive of cellulitis.  He also had a fever and was tachycardic consistent with sepsis so she was treated with IV vancomycin with significant improvement and discharged on a course of Keflex with as needed Vicodin for pain.  Was on the right side of her face. Is not sore anylonger.  Still got a little bump in her nose where she thinks it started - but not as painful.   History of vitamin D deficiency on replacement.  Is taking 1000iu vitamin D she thinks - taking daily but not really sure of iu amount  Blood pressures were significantly elevated during her hospitalization - no h/o HTN  H/o sig LFT elevation in 2016 of unknown etiology but resolved spontaneously - had nml liver US and further eval at that time - no sign of fatty liver then and lipid panel cont to be excellent.  Has been drinking a lot of Cheerwine - family forces it on her and she loves it - her favorite - so suspects that is why she is borderline pre-DM.  Medical History Past Medical History:  Diagnosis Date  . Asthma   . Back pain   . Carpal tunnel syndrome    left wrist  . Fibroids   . Vitamin D deficiency    Past Surgical History:  Procedure Laterality Date  . MYOMECTOMY     Current Outpatient Medications on File Prior  to Visit  Medication Sig Dispense Refill  . cholecalciferol (VITAMIN D) 1000 units tablet Take 1,000 Units by mouth daily.    . Multiple Vitamin (MULTIVITAMIN) tablet Take 1 tablet by mouth daily.     No current facility-administered medications on file prior to visit.    No Known Allergies Family History  Problem Relation Age of Onset  . Colon cancer Neg Hx   . Colon polyps Neg Hx   . Esophageal cancer Neg Hx   . Rectal cancer Neg Hx   . Stomach cancer Neg Hx    Social History   Socioeconomic History  . Marital status: Single    Spouse name: Not on file  . Number of children: 0  . Years of education: Not on file  . Highest education level: Not on file  Occupational History  . Occupation: bus Geophysicist/field seismologist  Social Needs  . Financial resource strain: Not on file  . Food insecurity:    Worry: Not on file    Inability: Not on file  . Transportation needs:    Medical: Not on file    Non-medical: Not on file  Tobacco Use  . Smoking status: Never Smoker  . Smokeless tobacco: Never Used  Substance and Sexual Activity  . Alcohol use: Yes    Alcohol/week: 1.0 standard drinks  Types: 1 Glasses of wine per week    Comment: occassionally  . Drug use: No  . Sexual activity: Not on file  Lifestyle  . Physical activity:    Days per week: Not on file    Minutes per session: Not on file  . Stress: Not on file  Relationships  . Social connections:    Talks on phone: Not on file    Gets together: Not on file    Attends religious service: Not on file    Active member of club or organization: Not on file    Attends meetings of clubs or organizations: Not on file    Relationship status: Not on file  Other Topics Concern  . Not on file  Social History Narrative  . Not on file   Depression screen [facility] 2/9 04/17/2018 11/26/2017 04/13/2015 10/14/2014  Decreased Interest 0 0 0 0  Down, Depressed, Hopeless 0 0 0 0  PHQ - 2 Score 0 0 0 0    ROS As noted in HPI  Objective:  BP (!)  141/92   Pulse 67   Temp 99 F (37.2 C) (Oral)   Resp 18   Ht '5\' 5"'  (1.651 m)   Wt 224 lb 3.2 oz (101.7 kg)   SpO2 100%   BMI 37.31 kg/m  Physical Exam Constitutional:      General: She is not in acute distress.    Appearance: She is well-developed. She is not diaphoretic.  HENT:     Head: Normocephalic and atraumatic.     Right Ear: External ear normal.     Left Ear: External ear normal.  Eyes:     General: No scleral icterus.    Conjunctiva/sclera: Conjunctivae normal.  Neck:     Musculoskeletal: Normal range of motion and neck supple.     Thyroid: No thyromegaly.  Cardiovascular:     Rate and Rhythm: Normal rate and regular rhythm.     Heart sounds: Normal heart sounds.  Pulmonary:     Effort: Pulmonary effort is normal. No respiratory distress.     Breath sounds: Normal breath sounds.  Lymphadenopathy:     Head:     Right side of head: Submandibular and tonsillar adenopathy present. No preauricular or posterior auricular adenopathy.     Left side of head: Submandibular and tonsillar adenopathy present. No preauricular or posterior auricular adenopathy.     Cervical: Cervical adenopathy present.     Right cervical: Superficial cervical adenopathy present. No posterior cervical adenopathy.    Left cervical: Superficial cervical adenopathy present. No posterior cervical adenopathy.     Upper Body:     Right upper body: No supraclavicular adenopathy.     Left upper body: No supraclavicular adenopathy.  Skin:    General: Skin is warm and dry.     Findings: No erythema.  Neurological:     Mental Status: She is alert and oriented to person, place, and time.  Psychiatric:        Behavior: Behavior normal.     [facility] Office Visit on 04/17/2018  Component Date Value Ref Range Status  . WBC 04/17/2018 7.9  4.6 - 10.2 K/uL Final  . Lymph, poc 04/17/2018 3.2  0.6 - 3.4 Final  . POC LYMPH PERCENT 04/17/2018 39.9  10 - 50 %L Final  . MID (cbc) 04/17/2018 0.3  0 - 0.9  Final  . POC MID % 04/17/2018 3.3  0 - 12 %M Final  . POC Granulocyte 04/17/2018  4.5  2 - 6.9 Final  . Granulocyte percent 04/17/2018 56.8  37 - 80 %G Final  . RBC 04/17/2018 4.39  4.04 - 5.48 M/uL Final  . Hemoglobin 04/17/2018 12.7  11 - 14.6 g/dL Final  . HCT, POC 04/17/2018 37.4  29 - 41 % Final  . MCV 04/17/2018 85.3  76 - 111 fL Final  . MCH, POC 04/17/2018 28.9  27 - 31.2 pg Final  . MCHC 04/17/2018 33.9  31.8 - 35.4 g/dL Final  . RDW, POC 04/17/2018 15.1  % Final  . Platelet Count, POC 04/17/2018 319  142 - 424 K/uL Final  . MPV 04/17/2018 6.8  0 - 99.8 fL Final  . Hemoglobin A1C 04/17/2018 5.7* 4.0 - 5.6 % Final    Assessment & Plan:   1. Lymphadenopathy of head and neck - highly likely to be due to cellulitis as presented with such acute angioedema and erythema across face with erythema, pain, warmth - was septic w/ fever, tachycardia and improved almost immed on vanc IV. However, Ct reports note that malignancy as etiology cannot be totally excluded so recommend repeat CT scan after completed course of trx to ensure full resolution - pt agreeable so CT ordered.  CBC and ESR reassuring.  2. Vitamin D deficiency - on daily supplement - unsure of dose - close to nml so increase current supp by 1000iu/d  3. Prediabetes - stop cheerwine so will resolve  4. Transaminitis - unknown etiology - prior w/u in 2016 for [facility] higher LFT elev done w/o finding etiology so cont to monitor for now  5. Acute lymphadenitis   6. Localized swelling, mass or lump of neck     Patient will continue on current chronic medications other than changes noted above, so ok to refill when needed.   See after visit summary for patient specific instructions.  Orders Placed This Encounter  Procedures  . CT Soft Tissue Neck W Contrast    224 / not diab / no kidney problems / nkda to ct dye / no needs / Emergency planning/management officer w pt    Standing Status:   Future    Standing Expiration Date:   07/17/2019     Order Specific Question:   ** REASON FOR EXAM (FREE TEXT)    Answer:   f/u on prior severe lymphadenopathy that was out of proportion to facial cellulitis    Order Specific Question:   If indicated for the ordered procedure, I authorize the administration of contrast media per Radiology protocol    Answer:   Yes    Order Specific Question:   Is patient pregnant?    Answer:   No    Order Specific Question:   Preferred imaging location?    Answer:   [id number]    Order Specific Question:   Radiology Contrast Protocol - do NOT remove file path    Answer:   [email or web address]  . Sedimentation rate  . VITAMIN D 25 Hydroxy (Vit-D Deficiency, Fractures)  . Comprehensive metabolic panel  . POCT CBC  . POCT glycosylated hemoglobin (Hb A1C)    Meds ordered this encounter  Medications  . mupirocin ointment (BACTROBAN) 2 %    Sig: Place 1 application into the nose 2 (two) times daily. X 1 week    Dispense:  30 g    Refill:  0    Patient verbalized to me that they understand the following: diagnosis, what is being done for them, what  to expect and what should be done at home.  Their questions have been answered. They understand that I am unable to predict every possible medication interaction or adverse outcome and that if any unexpected symptoms arise, they should contact us and their pharmacist, as well as never hesitate to seek urgent/emergent care at [facility] or ER if they think it might be warranted.    Delman Cheadle, MD, MPH [facility] [address] [place], [place]  [phone] Office phone  [phone] Office fax  04/17/18 2:21 PM

## 2018-04-18 LAB — COMPREHENSIVE METABOLIC PANEL
A/G RATIO: 1.4 (ref 1.2–2.2)
ALT: 42 IU/L — AB (ref 0–32)
AST: 30 IU/L (ref 0–40)
Albumin: 4.1 g/dL (ref 3.5–5.5)
Alkaline Phosphatase: 96 IU/L (ref 39–117)
BUN/Creatinine Ratio: 11 (ref 9–23)
BUN: 10 mg/dL (ref 6–24)
Bilirubin Total: 0.2 mg/dL (ref 0.0–1.2)
CALCIUM: 9.2 mg/dL (ref 8.7–10.2)
CO2: 21 mmol/L (ref 20–29)
Chloride: 103 mmol/L (ref 96–106)
Creatinine, Ser: 0.92 mg/dL (ref 0.57–1.00)
GFR calc Af Amer: 83 mL/min/{1.73_m2} (ref >59)
GFR, EST NON AFRICAN AMERICAN: 72 mL/min/{1.73_m2} (ref >59)
GLOBULIN, TOTAL: 2.9 g/dL (ref 1.5–4.5)
Glucose: 93 mg/dL (ref 65–99)
POTASSIUM: 4.4 mmol/L (ref 3.5–5.2)
SODIUM: 139 mmol/L (ref 134–144)
Total Protein: 7 g/dL (ref 6.0–8.5)

## 2018-04-18 LAB — VITAMIN D 25 HYDROXY (VIT D DEFICIENCY, FRACTURES): VIT D 25 HYDROXY: 29.5 ng/mL — AB (ref 30.0–100.0)

## 2018-04-18 LAB — SEDIMENTATION RATE: SED RATE: 21 mm/h (ref 0–40)

## 2018-04-20 ENCOUNTER — Encounter: Payer: Self-pay | Admitting: *Deleted

## 2018-04-23 ENCOUNTER — Ambulatory Visit
Admission: RE | Admit: 2018-04-23 | Discharge: 2018-04-23 | Disposition: A | Discharge status: Home / Self Care | Payer: Managed Care, Other (non HMO) | Source: Ambulatory Visit | Attending: Family Medicine | Admitting: Family Medicine

## 2018-04-23 DIAGNOSIS — L049 Acute lymphadenitis, unspecified: Secondary | ICD-10-CM

## 2018-04-23 DIAGNOSIS — R221 Localized swelling, mass and lump, neck: Secondary | ICD-10-CM

## 2018-04-23 MED ORDER — IOPAMIDOL (ISOVUE-300) INJECTION 61%
75.0000 mL | Freq: Once | INTRAVENOUS | Status: AC | PRN
  Administered 2018-04-23: 75 mL via INTRAVENOUS

## 2018-11-30 ENCOUNTER — Encounter: Payer: Managed Care, Other (non HMO) | Admitting: Family Medicine

## 2019-04-26 ENCOUNTER — Ambulatory Visit: Payer: Managed Care, Other (non HMO) | Attending: Internal Medicine

## 2019-04-26 DIAGNOSIS — R238 Other skin changes: Secondary | ICD-10-CM

## 2019-04-26 DIAGNOSIS — U071 COVID-19: Secondary | ICD-10-CM

## 2019-04-27 LAB — NOVEL CORONAVIRUS, NAA: SARS-CoV-2, NAA: NOT DETECTED

## 2019-11-02 ENCOUNTER — Encounter (HOSPITAL_COMMUNITY): Payer: Self-pay

## 2019-11-02 ENCOUNTER — Observation Stay (HOSPITAL_COMMUNITY)
Admission: EM | Admit: 2019-11-02 | Discharge: 2019-11-05 | Disposition: A | Discharge status: Home / Self Care | Payer: BC Managed Care – PPO | Attending: Physician Assistant | Admitting: Physician Assistant

## 2019-11-02 ENCOUNTER — Other Ambulatory Visit: Payer: Self-pay

## 2019-11-02 DIAGNOSIS — Z20822 Contact with and (suspected) exposure to covid-19: Secondary | ICD-10-CM | POA: Insufficient documentation

## 2019-11-02 DIAGNOSIS — R112 Nausea with vomiting, unspecified: Secondary | ICD-10-CM | POA: Diagnosis not present

## 2019-11-02 DIAGNOSIS — Z79899 Other long term (current) drug therapy: Secondary | ICD-10-CM | POA: Diagnosis not present

## 2019-11-02 DIAGNOSIS — J45909 Unspecified asthma, uncomplicated: Secondary | ICD-10-CM | POA: Diagnosis not present

## 2019-11-02 DIAGNOSIS — R109 Unspecified abdominal pain: Secondary | ICD-10-CM | POA: Diagnosis present

## 2019-11-02 DIAGNOSIS — K810 Acute cholecystitis: Secondary | ICD-10-CM | POA: Diagnosis not present

## 2019-11-02 DIAGNOSIS — K819 Cholecystitis, unspecified: Secondary | ICD-10-CM

## 2019-11-02 LAB — URINALYSIS, ROUTINE W REFLEX MICROSCOPIC
Bacteria, UA: NONE SEEN
Bilirubin Urine: NEGATIVE
Glucose, UA: NEGATIVE mg/dL
Ketones, ur: NEGATIVE mg/dL
Leukocytes,Ua: NEGATIVE
Nitrite: NEGATIVE
Protein, ur: 30 mg/dL — AB
Specific Gravity, Urine: 1.028 (ref 1.005–1.030)
pH: 6 (ref 5.0–8.0)

## 2019-11-02 LAB — COMPREHENSIVE METABOLIC PANEL
ALT: 25 U/L (ref 0–44)
AST: 24 U/L (ref 15–41)
Albumin: 3.7 g/dL (ref 3.5–5.0)
Alkaline Phosphatase: 70 U/L (ref 38–126)
Anion gap: 11 (ref 5–15)
BUN: 14 mg/dL (ref 6–20)
CO2: 25 mmol/L (ref 22–32)
Calcium: 9.4 mg/dL (ref 8.9–10.3)
Chloride: 101 mmol/L (ref 98–111)
Creatinine, Ser: 0.89 mg/dL (ref 0.44–1.00)
GFR calc Af Amer: >60 mL/min (ref >60)
GFR calc non Af Amer: >60 mL/min (ref >60)
Glucose, Bld: 122 mg/dL — ABNORMAL HIGH (ref 70–99)
Potassium: 3.7 mmol/L (ref 3.5–5.1)
Sodium: 137 mmol/L (ref 135–145)
Total Bilirubin: 0.5 mg/dL (ref 0.3–1.2)
Total Protein: 7.7 g/dL (ref 6.5–8.1)

## 2019-11-02 LAB — CBC
HCT: 40.5 % (ref 36.0–46.0)
Hemoglobin: 13.5 g/dL (ref 12.0–15.0)
MCH: 29.1 pg (ref 26.0–34.0)
MCHC: 33.3 g/dL (ref 30.0–36.0)
MCV: 87.3 fL (ref 80.0–100.0)
Platelets: 312 10*3/uL (ref 150–400)
RBC: 4.64 MIL/uL (ref 3.87–5.11)
RDW: 13.2 % (ref 11.5–15.5)
WBC: 11.1 10*3/uL — ABNORMAL HIGH (ref 4.0–10.5)
nRBC: 0 % (ref 0.0–0.2)

## 2019-11-02 LAB — I-STAT BETA HCG BLOOD, ED (MC, WL, AP ONLY): I-stat hCG, quantitative: <5 m[IU]/mL (ref <5)

## 2019-11-02 LAB — LIPASE, BLOOD: Lipase: 37 U/L (ref 11–51)

## 2019-11-02 MED ORDER — ONDANSETRON 4 MG PO TBDP
4.0000 mg | ORAL_TABLET | Freq: Once | ORAL | Status: AC | PRN
  Administered 2019-11-02: 4 mg via ORAL
  Filled 2019-11-02: qty 1

## 2019-11-02 NOTE — ED Triage Notes (Signed)
Pt reports generalized abd pain and emesis since 3am this morning.

## 2019-11-03 ENCOUNTER — Emergency Department (HOSPITAL_COMMUNITY): Payer: BC Managed Care – PPO

## 2019-11-03 ENCOUNTER — Encounter (HOSPITAL_COMMUNITY): Payer: Self-pay | Admitting: Student

## 2019-11-03 DIAGNOSIS — K819 Cholecystitis, unspecified: Secondary | ICD-10-CM | POA: Diagnosis present

## 2019-11-03 LAB — SARS CORONAVIRUS 2 BY RT PCR (HOSPITAL ORDER, PERFORMED IN ~~LOC~~ HOSPITAL LAB): SARS Coronavirus 2: NEGATIVE

## 2019-11-03 MED ORDER — SODIUM CHLORIDE 0.9 % IV SOLN
2.0000 g | Freq: Once | INTRAVENOUS | Status: AC
  Administered 2019-11-03: 2 g via INTRAVENOUS
  Filled 2019-11-03: qty 20

## 2019-11-03 MED ORDER — ONDANSETRON HCL 4 MG/2ML IJ SOLN
4.0000 mg | Freq: Once | INTRAMUSCULAR | Status: AC
  Administered 2019-11-03: 4 mg via INTRAVENOUS
  Filled 2019-11-03: qty 2

## 2019-11-03 MED ORDER — ONDANSETRON 4 MG PO TBDP: 4.0000 mg | ORAL_TABLET | Freq: Four times a day (QID) | ORAL | Status: DC | PRN

## 2019-11-03 MED ORDER — SODIUM CHLORIDE 0.9 % IV BOLUS
1000.0000 mL | Freq: Once | INTRAVENOUS | Status: AC
  Administered 2019-11-03: 1000 mL via INTRAVENOUS

## 2019-11-03 MED ORDER — OXYCODONE HCL 5 MG/5ML PO SOLN
5.0000 mg | ORAL | Status: DC | PRN
  Administered 2019-11-04: 10 mg via ORAL
  Administered 2019-11-05: 5 mg via ORAL
  Filled 2019-11-03: qty 10
  Filled 2019-11-03: qty 5

## 2019-11-03 MED ORDER — SODIUM CHLORIDE 0.9 % IV SOLN
2.0000 g | INTRAVENOUS | Status: DC
  Filled 2019-11-03: qty 20

## 2019-11-03 MED ORDER — MORPHINE SULFATE (PF) 4 MG/ML IV SOLN
4.0000 mg | Freq: Once | INTRAVENOUS | Status: AC
  Administered 2019-11-03: 4 mg via INTRAVENOUS
  Filled 2019-11-03: qty 1

## 2019-11-03 MED ORDER — ONDANSETRON HCL 4 MG/2ML IJ SOLN: 4.0000 mg | Freq: Four times a day (QID) | INTRAMUSCULAR | Status: DC | PRN

## 2019-11-03 MED ORDER — ENOXAPARIN SODIUM 40 MG/0.4ML ~~LOC~~ SOLN
40.0000 mg | SUBCUTANEOUS | Status: DC
  Administered 2019-11-03 – 2019-11-04 (×2): 40 mg via SUBCUTANEOUS
  Filled 2019-11-03 (×2): qty 0.4

## 2019-11-03 MED ORDER — MUPIROCIN 2 % EX OINT: 1.0000 "application " | TOPICAL_OINTMENT | Freq: Two times a day (BID) | CUTANEOUS | Status: DC

## 2019-11-03 MED ORDER — METHOCARBAMOL 500 MG PO TABS
1000.0000 mg | ORAL_TABLET | Freq: Three times a day (TID) | ORAL | Status: DC
  Administered 2019-11-03 – 2019-11-05 (×6): 1000 mg via ORAL
  Filled 2019-11-03 (×6): qty 2

## 2019-11-03 MED ORDER — ACETAMINOPHEN 500 MG PO TABS
1000.0000 mg | ORAL_TABLET | Freq: Four times a day (QID) | ORAL | Status: DC
  Administered 2019-11-03 – 2019-11-05 (×5): 1000 mg via ORAL
  Filled 2019-11-03 (×5): qty 2

## 2019-11-03 MED ORDER — LACTATED RINGERS IV SOLN: INTRAVENOUS | Status: DC

## 2019-11-03 MED ORDER — DOCUSATE SODIUM 100 MG PO CAPS
100.0000 mg | ORAL_CAPSULE | Freq: Two times a day (BID) | ORAL | Status: DC
  Administered 2019-11-03 – 2019-11-05 (×4): 100 mg via ORAL
  Filled 2019-11-03 (×5): qty 1

## 2019-11-03 MED ORDER — IOHEXOL 300 MG/ML  SOLN
100.0000 mL | Freq: Once | INTRAMUSCULAR | Status: AC | PRN
  Administered 2019-11-03: 100 mL via INTRAVENOUS

## 2019-11-03 MED ORDER — MORPHINE SULFATE (PF) 2 MG/ML IV SOLN: 2.0000 mg | INTRAVENOUS | Status: DC | PRN

## 2019-11-03 NOTE — ED Notes (Signed)
Attempted report x 2 

## 2019-11-03 NOTE — ED Notes (Signed)
Pt toileted.  

## 2019-11-03 NOTE — ED Notes (Signed)
Attempted report x1. 

## 2019-11-03 NOTE — Anesthesia Preprocedure Evaluation (Addendum)
Anesthesia Evaluation  Patient identified by MRN, date of birth, ID band  Reviewed: Allergy & Precautions, NPO status , Patient's Chart, lab work & pertinent test results  Airway Mallampati: II  TM Distance: >3 FB Neck ROM: Full    Dental no notable dental hx. (+) Teeth Intact, Dental Advisory Given   Pulmonary asthma ,    Pulmonary exam normal breath sounds clear to auscultation       Cardiovascular Exercise Tolerance: Good negative cardio ROS Normal cardiovascular exam Rhythm:Regular Rate:Normal     Neuro/Psych negative neurological ROS  negative psych ROS   GI/Hepatic negative GI ROS, Neg liver ROS,   Endo/Other  negative endocrine ROS  Renal/GU negative Renal ROS     Musculoskeletal   Abdominal   Peds  Hematology negative hematology ROS (+)   Anesthesia Other Findings   Reproductive/Obstetrics                            Anesthesia Physical Anesthesia Plan  ASA: II  Anesthesia Plan: General   Post-op Pain Management:    Induction: Intravenous  PONV Risk Score and Plan: 4 or greater and Treatment may vary due to age or medical condition, Ondansetron, Dexamethasone and Midazolam  Airway Management Planned: Oral ETT  Additional Equipment: None  Intra-op Plan:   Post-operative Plan: Extubation in OR  Informed Consent: I have reviewed the patients History and Physical, chart, labs and discussed the procedure including the risks, benefits and alternatives for the proposed anesthesia with the patient or authorized representative who has indicated his/her understanding and acceptance.     Dental advisory given  Plan Discussed with:   Anesthesia Plan Comments:        Anesthesia Quick Evaluation

## 2019-11-03 NOTE — Discharge Instructions (Addendum)
[facility]  Please arrive at least 30 min before your appointment to complete your check in paperwork.  If you are unable to arrive 30 min prior to your appointment time we may have to cancel or reschedule you. LAPAROSCOPIC SURGERY: POST OP INSTRUCTIONS Always review your discharge instruction sheet given to you by the facility where your surgery was performed. IF YOU HAVE DISABILITY OR FAMILY LEAVE FORMS, YOU MUST BRING THEM TO THE OFFICE FOR PROCESSING.   DO NOT GIVE THEM TO YOUR DOCTOR.  PAIN CONTROL  1. First take acetaminophen (Tylenol) AND/or ibuprofen (Advil) to control your pain after surgery.  Follow directions on package.  Taking acetaminophen (Tylenol) and/or ibuprofen (Advil) regularly after surgery will help to control your pain and lower the amount of prescription pain medication you may need.  You should not take more than 4,000 mg (4 grams) of acetaminophen (Tylenol) in 24 hours.  You should not take ibuprofen (Advil), aleve, motrin, naprosyn or other NSAIDS if you have a history of stomach ulcers or chronic kidney disease.  2. A prescription for pain medication may be given to you upon discharge.  Take your pain medication as prescribed, if you still have uncontrolled pain after taking acetaminophen (Tylenol) or ibuprofen (Advil). 3. Use ice packs to help control pain. 4. If you need a refill on your pain medication, please contact your pharmacy.  They will contact our office to request authorization. Prescriptions will not be filled after 5pm or on week-ends.  HOME MEDICATIONS 5. Take your usually prescribed medications unless otherwise directed.  DIET 6. You should follow a light diet the first few days after arrival home.  Be sure to include lots of fluids daily. Avoid fatty, fried foods.   CONSTIPATION 7. It is common to experience some constipation after surgery and if you are taking pain medication.  Increasing fluid intake and taking a stool  softener (such as Colace) will usually help or prevent this problem from occurring.  A mild laxative (Milk of Magnesia or Miralax) should be taken according to package instructions if there are no bowel movements after 48 hours.  WOUND/INCISION CARE 8. Most patients will experience some swelling and bruising in the area of the incisions.  Ice packs will help.  Swelling and bruising can take several days to resolve.  9. Unless discharge instructions indicate otherwise, follow guidelines below  a. STERI-STRIPS - you may remove your outer bandages 48 hours after surgery, and you may shower at that time.  You have steri-strips (small skin tapes) in place directly over the incision.  These strips should be left on the skin for 7-10 days.   b. DERMABOND/SKIN GLUE - you may shower in 24 hours.  The glue will flake off over the next 2-3 weeks. 10. Any sutures or staples will be removed at the office during your follow-up visit.  ACTIVITIES 11. You may resume regular (light) daily activities beginning the next day--such as daily self-care, walking, climbing stairs--gradually increasing activities as tolerated.  You may have sexual intercourse when it is comfortable.  Refrain from any heavy lifting or straining until approved by your doctor. a. You may drive when you are no longer taking prescription pain medication, you can comfortably wear a seatbelt, and you can safely maneuver your car and apply brakes.  FOLLOW-UP 12. You should see your doctor in the office for a follow-up appointment approximately 2-3 weeks after your surgery.  You should have been given your post-op/follow-up appointment when   your surgery was scheduled.  If you did not receive a post-op/follow-up appointment, make sure that you call for this appointment within a day or two after you arrive home to insure a convenient appointment time.  OTHER INSTRUCTIONS  WHEN TO CALL YOUR DOCTOR: 1. Fever over 101.0 2. Inability to  urinate 3. Continued bleeding from incision. 4. Increased pain, redness, or drainage from the incision. 5. Increasing abdominal pain  The clinic staff is available to answer your questions during regular business hours.  Please don't hesitate to call and ask to speak to one of the nurses for clinical concerns.  If you have a medical emergency, go to the nearest emergency room or call 911.  A surgeon from [facility] is always on call at the hospital. [address] [place], [place], [place]  [address] ? [address], [place], [place]   [id number] [phone] ? [phone] ? FAX [phone]   ........Marland Kitchen   Managing Your Pain After Surgery Without Opioids    Thank you for participating in our program to help patients manage their pain after surgery without opioids. This is part of our effort to provide you with the best care possible, without exposing you or your family to the risk that opioids pose.  What pain can I expect after surgery? You can expect to have some pain after surgery. This is normal. The pain is typically worse the day after surgery, and quickly begins to get better. Many studies have found that many patients are able to manage their pain after surgery with Over-the-Counter (OTC) medications such as Tylenol and Motrin. If you have a condition that does not allow you to take Tylenol or Motrin, notify your surgical team.  How will I manage my pain? The best strategy for controlling your pain after surgery is around the clock pain control with Tylenol (acetaminophen) and Motrin (ibuprofen or Advil). Alternating these medications with each other allows you to maximize your pain control. In addition to Tylenol and Motrin, you can use heating pads or ice packs on your incisions to help reduce your pain.  How will I alternate your regular strength over-the-counter pain medication? You will take a dose of pain medication every three hours. ; Start by taking  650 mg of Tylenol (2 pills of 325 mg) ; 3 hours later take 600 mg of Motrin (3 pills of 200 mg) ; 3 hours after taking the Motrin take 650 mg of Tylenol ; 3 hours after that take 600 mg of Motrin.   - 1 -  See example - if your first dose of Tylenol is at 12:00 PM   12:00 PM Tylenol 650 mg (2 pills of 325 mg)  3:00 PM Motrin 600 mg (3 pills of 200 mg)  6:00 PM Tylenol 650 mg (2 pills of 325 mg)  9:00 PM Motrin 600 mg (3 pills of 200 mg)  Continue alternating every 3 hours   We recommend that you follow this schedule around-the-clock for at least 3 days after surgery, or until you feel that it is no longer needed. Use the table on the last page of this handout to keep track of the medications you are taking. Important: Do not take more than 3000mg  of Tylenol or 3200mg  of Motrin in a 24-hour period. Do not take ibuprofen/Motrin if you have a history of bleeding stomach ulcers, severe kidney disease, &/or actively taking a blood thinner  What if I still have pain? If you have pain  that is not controlled with the over-the-counter pain medications (Tylenol and Motrin or Advil) you might have what we call "breakthrough" pain. You will receive a prescription for a small amount of an opioid pain medication such as Oxycodone, Tramadol, or Tylenol with Codeine. Use these opioid pills in the first 24 hours after surgery if you have breakthrough pain. Do not take more than 1 pill every 4-6 hours.  If you still have uncontrolled pain after using all opioid pills, don't hesitate to call our staff using the number provided. We will help make sure you are managing your pain in the best way possible, and if necessary, we can provide a prescription for additional pain medication.   Day 1    Time  Name of Medication Number of pills taken  Amount of Acetaminophen  Pain Level   Comments  AM PM       AM PM       AM PM       AM PM       AM PM       AM PM       AM PM       AM PM       Total Daily  amount of Acetaminophen Do not take more than  3,000 mg per day      Day 2    Time  Name of Medication Number of pills taken  Amount of Acetaminophen  Pain Level   Comments  AM PM       AM PM       AM PM       AM PM       AM PM       AM PM       AM PM       AM PM       Total Daily amount of Acetaminophen Do not take more than  3,000 mg per day      Day 3    Time  Name of Medication Number of pills taken  Amount of Acetaminophen  Pain Level   Comments  AM PM       AM PM       AM PM       AM PM          AM PM       AM PM       AM PM       AM PM       Total Daily amount of Acetaminophen Do not take more than  3,000 mg per day      Day 4    Time  Name of Medication Number of pills taken  Amount of Acetaminophen  Pain Level   Comments  AM PM       AM PM       AM PM       AM PM       AM PM       AM PM       AM PM       AM PM       Total Daily amount of Acetaminophen Do not take more than  3,000 mg per day      Day 5    Time  Name of Medication Number of pills taken  Amount of Acetaminophen  Pain Level   Comments  AM PM       AM PM  AM PM       AM PM       AM PM       AM PM       AM PM       AM PM       Total Daily amount of Acetaminophen Do not take more than  3,000 mg per day       Day 6    Time  Name of Medication Number of pills taken  Amount of Acetaminophen  Pain Level  Comments  AM PM       AM PM       AM PM       AM PM       AM PM       AM PM       AM PM       AM PM       Total Daily amount of Acetaminophen Do not take more than  3,000 mg per day      Day 7    Time  Name of Medication Number of pills taken  Amount of Acetaminophen  Pain Level   Comments  AM PM       AM PM       AM PM       AM PM       AM PM       AM PM       AM PM       AM PM       Total Daily amount of Acetaminophen Do not take more than  3,000 mg per day        For additional information about how and where to  safely dispose of unused opioid medications - [employer]  Disclaimer: This document contains information and/or instructional materials adapted from [place] for the typical patient with your condition. It does not replace medical advice from your health care provider because your experience may differ from that of the typical patient. Talk to your health care provider if you have any questions about this document, your condition or your treatment plan. Adapted from [place]

## 2019-11-03 NOTE — ED Notes (Signed)
Pt ambulated to RR .  

## 2019-11-03 NOTE — Progress Notes (Signed)
Pt arrived to 6N17 via stretcher. Pt awake and alert. Pt ambulated from stretcher to bed. Pt stated pain 4/10 due to activity. See MAR. See assessment. Will continue to monitor pt.

## 2019-11-03 NOTE — ED Notes (Signed)
Patient transported to US 

## 2019-11-03 NOTE — H&P (Signed)
[facility] Surgery Admission Note  Megan Gardner [birth year]  [id number].    Requesting MD/Attending- Dr. Melina Copa Chief Complaint/Reason for Consult: Cholecystitis   HPI: Megan Gardner is a 54 yo female who presented to [facility] hospital with abdominal pain.  Patient reports that her abdominal pain began yesterday morning around 3AM. She was awoken in her sleep with the pain. The evening prior she had eaten pizza, wings and salad.  Her pain was a constant, sharp generalized abdominal pain that gradually worsened. Pain is diffuse but she points to her right abdomen as to where the majority of the pain is located. She notes associated nausea and 4 episodes of emesis.  No history of similar symptoms in the past.  Denies interventions at home.  Worse with palpation, movement, and laughter.  Unsure if worse with eating.  In the ED she was noted to be afebrile without tachycardia, tachypnea or hypotension.  WBC 11.1.  Lipase and LFTs within normal limits.  T bili 0.5.  CT A/P shows gallbladder wall thickening and adjacent fat stranding concerning for acute cholecystitis.  Right upper quadrant ultrasound shows multiple gallstones with the largest measuring 10 mm with associated gallbladder wall thickening and pericholecystic fluid.  CBD 4 mm. She was started on abx and general surgery was asked to see.   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  Respiratory: Negative for cough and shortness of breath.   Cardiovascular: Negative for chest pain and leg swelling.  Gastrointestinal: Positive for abdominal pain, nausea and vomiting.  Genitourinary: Negative for dysuria.  Musculoskeletal: Negative for back pain.  Psychiatric/Behavioral: Negative for substance abuse.  All other systems  reviewed and are negative. All systems reviewed and otherwise negative except for as above  Family History  Problem Relation Age of Onset  . Colon cancer Neg Hx   . Colon polyps Neg Hx   . Esophageal cancer Neg Hx   . Rectal cancer Neg Hx   . Stomach cancer Neg Hx     Past Medical History:  Diagnosis Date  . Asthma   . Back pain   . Carpal tunnel syndrome    left wrist  . Fibroids   . Vitamin D deficiency     Past Surgical History:  Procedure Laterality Date  . MYOMECTOMY      Social History:  reports that she has never smoked. She has never used smokeless tobacco. She reports current alcohol use of about 1.0 standard drink of alcohol per week. She reports that she does not use drugs.  Allergies: No Known Allergies  (Not in a hospital admission)   Prior to Admission medications   Medication Sig Start Date End Date Taking? Authorizing Provider  cholecalciferol (VITAMIN D) 1000 units tablet Take 1,000 Units by mouth daily.   Yes [provider]  Multiple Vitamin (MULTIVITAMIN) tablet Take 1 tablet by mouth daily.   Yes [provider]  mupirocin ointment (BACTROBAN) 2 % Place 1 application into the nose 2 (two) times daily. X 1 week Patient not taking: Reported on 11/03/2019 04/17/18   Shawnee Knapp, MD    Blood pressure (!) 131/92, pulse 92, temperature (!) 97.4 F (36.3 C), temperature source Oral, resp. rate 18, height 5\' 5"  (1.651 m), weight 99.8 kg, last menstrual period 10/29/2019, SpO2 99 %. Physical Exam:  General: pleasant, WD/WN female who is laying in bed in NAD HEENT: head is normocephalic, atraumatic.  Sclera are noninjected.  Pupils equal and round.  Ears and nose without any masses or lesions.  Mouth is pink and moist. Dentition fair Heart: regular, rate, and rhythm. Palpable pedal pulses bilaterally  Lungs: no audible wheezes, rhonchi, or rales noted.  Respiratory effort nonlabored on room air Abd: Soft, ND, mild diffuse tenderness with  more significant TTP RLQ and RUQ, no masses, hernias, or organomegaly MS: no BUE/BLE edema, calves soft and nontender Skin: warm and dry with no masses, lesions, or rashes Psych: A&Ox4 with an appropriate affect Neuro: cranial nerves grossly intact, equal strength in BUE/BLE bilaterally, normal speech, thought process intact  Results for orders placed or performed during the hospital encounter of 11/02/19 (from the past 48 hour(s))  Lipase, blood     Status: None   Collection Time: 11/02/19  6:54 PM  Result Value Ref Range   Lipase 37 11 - 51 U/L    Comment: Performed at [facility], [address] [place], [place] [address]  Comprehensive metabolic panel     Status: Abnormal   Collection Time: 11/02/19  6:54 PM  Result Value Ref Range   Sodium 137 135 - 145 mmol/L   Potassium 3.7 3.5 - 5.1 mmol/L   Chloride 101 98 - 111 mmol/L   CO2 25 22 - 32 mmol/L   Glucose, Bld 122 (H) 70 - 99 mg/dL    Comment: Glucose reference range applies only to samples taken after fasting for at least 8 hours.   BUN 14 6 - 20 mg/dL   Creatinine, Ser 0.89 0.44 - 1.00 mg/dL   Calcium 9.4 8.9 - 10.3 mg/dL   Total Protein 7.7 6.5 - 8.1 g/dL   Albumin 3.7 3.5 - 5.0 g/dL   AST 24 15 - 41 U/L   ALT 25 0 - 44 U/L   Alkaline Phosphatase 70 38 - 126 U/L   Total Bilirubin 0.5 0.3 - 1.2 mg/dL   GFR calc non Af Amer >60 >60 mL/min   GFR calc Af Amer >60 >60 mL/min   Anion gap 11 5 - 15    Comment: Performed at [facility], [place] [address], [place], [place] [address]  CBC     Status: Abnormal   Collection Time: 11/02/19  6:54 PM  Result Value Ref Range   WBC 11.1 (H) 4.0 - 10.5 K/uL   RBC 4.64 3.87 - 5.11 MIL/uL   Hemoglobin 13.5 12.0 - 15.0 g/dL   HCT 40.5 36 - 46 %   MCV 87.3 80.0 - 100.0 fL   MCH 29.1 26.0 - 34.0 pg   MCHC 33.3 30.0 - 36.0 g/dL   RDW 13.2 11.5 - 15.5 %   Platelets 312 150 - 400 K/uL   nRBC 0.0 0.0 - 0.2 %    Comment: Performed at [facility], [place] [address]  [place], [place] [address]  Urinalysis, Routine w reflex microscopic     Status: Abnormal   Collection Time: 11/02/19  7:05 PM  Result Value Ref Range   Color, Urine YELLOW YELLOW   APPearance CLEAR CLEAR   Specific Gravity, Urine 1.028 1.005 - 1.030   pH 6.0 5.0 - 8.0   Glucose, UA NEGATIVE NEGATIVE mg/dL   Hgb urine dipstick MODERATE (A) NEGATIVE   Bilirubin Urine NEGATIVE NEGATIVE   Ketones, ur NEGATIVE NEGATIVE mg/dL   Protein, ur 30 (A)  NEGATIVE mg/dL   Nitrite NEGATIVE NEGATIVE   Leukocytes,Ua NEGATIVE NEGATIVE   RBC / HPF 0-5 0 - 5 RBC/hpf   WBC, UA 0-5 0 - 5 WBC/hpf   Bacteria, UA NONE SEEN NONE SEEN   Squamous Epithelial / LPF 0-5 0 - 5   Mucus PRESENT     Comment: Performed at [facility], [place] [address] [place], [place] [address]  I-Stat beta hCG blood, ED     Status: None   Collection Time: 11/02/19  7:13 PM  Result Value Ref Range   I-stat hCG, quantitative <5.0 <5 mIU/mL   Comment 3            Comment:   GEST. AGE      CONC.  (mIU/mL)   <=1 WEEK        5 - 50     2 WEEKS       50 - 500     3 WEEKS       100 - 10,000     4 WEEKS     1,000 - 30,000        FEMALE AND NON-PREGNANT FEMALE:     LESS THAN 5 mIU/mL    CT Abdomen Pelvis W Contrast  Result Date: 11/03/2019 CLINICAL DATA:  Generalized abdominal pain, emesis EXAM: CT ABDOMEN AND PELVIS WITH CONTRAST TECHNIQUE: Multidetector CT imaging of the abdomen and pelvis was performed using the standard protocol following bolus administration of intravenous contrast. CONTRAST:  162mL OMNIPAQUE IOHEXOL 300 MG/ML  SOLN COMPARISON:  None. FINDINGS: Lower chest: Trace bilateral pleural effusions and associated atelectasis or consolidation Hepatobiliary: No solid liver abnormality is seen. Gallbladder wall thickening and adjacent fat stranding (series 3, image 33). No radiopaque gallstones or biliary ductal dilatation. Pancreas: Unremarkable. No pancreatic ductal dilatation or surrounding inflammatory changes. Spleen: Normal  in size without significant abnormality. Adrenals/Urinary Tract: Adrenal glands are unremarkable. Left parapelvic cyst. No hydronephrosis. Bladder is unremarkable. Stomach/Bowel: Stomach is within normal limits. Appendix appears normal. No evidence of bowel wall thickening, distention, or inflammatory changes. Vascular/Lymphatic: No significant vascular findings are present. No enlarged abdominal or pelvic lymph nodes. Reproductive: Calcified uterine fibroids. Other: No abdominal wall hernia or abnormality. No abdominopelvic ascites. Musculoskeletal: No acute or significant osseous findings. IMPRESSION: 1. Gallbladder wall thickening and adjacent fat stranding, concerning for acute cholecystitis. No radiopaque gallstones or biliary ductal dilatation. Consider ultrasound and/or MRCP to further evaluate. 2. Trace bilateral pleural effusions and associated atelectasis or consolidation. 3. Uterine fibroids. Electronically Signed   By: Eddie Candle M.D.   On: 11/03/2019 13:37   US Abdomen Limited RUQ  Result Date: 11/03/2019 CLINICAL DATA:  Abdomen pain since 3 a.m. EXAM: ULTRASOUND ABDOMEN LIMITED RIGHT UPPER QUADRANT COMPARISON:  11/03/2019 FINDINGS: Gallbladder: Multiple shadowing gallstones are seen within the gallbladder, largest measuring 10 mm. There is gallbladder wall thickening with intramural edema and pericholecystic fluid. Gallbladder wall measures up to 8 mm. Common bile duct: Diameter: 4 mm Liver: No focal lesion identified. Within normal limits in parenchymal echogenicity. Portal vein is patent on color Doppler imaging with normal direction of blood flow towards the liver. Other: None. IMPRESSION: 1. Cholelithiasis, with sonographic evidence of acute cholecystitis. Electronically Signed   By: Randa Ngo M.D.   On: 11/03/2019 15:08   Anti-infectives (From admission, onward)   Start     Dose/Rate Route Frequency Ordered Stop   11/03/19 1600  cefTRIAXone (ROCEPHIN) 2 g in sodium chloride 0.9 %  100 mL IVPB  Discontinue     2 g 200 mL/hr over 30 Minutes Intravenous  Once 11/03/19 1550         Assessment/Plan Acute Cholecystitis  - Admit to med-surg, outpatient observation - Patient seen and examined with Dr. Bobbye Morton. She has explained the procedure, risks, and aftercare of cholecystectomy and patient wishes to proceed. Nordic for clear liquids tonight, NPO after midnight. Plan for surgery tomorrow. Continue antibiotics.  ID - Rocephin VTE - SCDs, Lovenox FEN - CLD, IVF, NPO after MN Foley - None Follow up - TBD  Margie Billet, [facility] 11/03/2019, 4:09 PM Please see [facility] for pager number during day hours 7:00am-4:30pm

## 2019-11-03 NOTE — ED Provider Notes (Signed)
[facility] EMERGENCY DEPARTMENT Provider Note   CSN: [id number] Arrival date & time: 11/02/19  [place]     History Chief Complaint  Patient presents with  . Abdominal Pain  . Emesis    Megan Gardner is a 54 y.o. female with a history of asthma, fibroids, & prior myomectomy who presents to the ED with complaints of abdominal pain that began @ 0300 yesterday AM. Patient states she woke from sleep after feeling okay prior to going to bed with generalized abdominal pain worse in the lower belly. Pain is constant, sharp, worse with coughing & bending forward, no alleviating factors. Reports associated nausea with approximately 4 episodes of non bloody emesis. She is unsure if this could be food related as she ate pizza, wings, and a salad prior to bed the night of onset. Last BM yesterday was normal. Denies fever, hematemesis, melena, hematochezia, diarrhea, constipation, dysuria, vaginal bleeding, or vaginal discharge. Patient is not sexually active and denies concern for STI.   HPI     Past Medical History:  Diagnosis Date  . Asthma   . Back pain   . Carpal tunnel syndrome    left wrist  . Fibroids   . Vitamin D deficiency     Patient Active Problem List   Diagnosis Date Noted  . Facial cellulitis 03/15/2018  . Vitamin D deficiency 10/22/2014  . Carpal tunnel syndrome of left wrist 10/22/2014  . Transaminitis 10/22/2014    Past Surgical History:  Procedure Laterality Date  . MYOMECTOMY       OB History   No obstetric history on file.     Family History  Problem Relation Age of Onset  . Colon cancer Neg Hx   . Colon polyps Neg Hx   . Esophageal cancer Neg Hx   . Rectal cancer Neg Hx   . Stomach cancer Neg Hx     Social History   Tobacco Use  . Smoking status: Never Smoker  . Smokeless tobacco: Never Used  Vaping Use  . Vaping Use: Never used  Substance Use Topics  . Alcohol use: Yes    Alcohol/week: 1.0 standard drink    Types: 1 Glasses  of wine per week    Comment: occassionally  . Drug use: No    Home Medications Prior to Admission medications   Medication Sig Start Date End Date Taking? Authorizing Provider  cholecalciferol (VITAMIN D) 1000 units tablet Take 1,000 Units by mouth daily.    [provider]  Multiple Vitamin (MULTIVITAMIN) tablet Take 1 tablet by mouth daily.    [provider]  mupirocin ointment (BACTROBAN) 2 % Place 1 application into the nose 2 (two) times daily. X 1 week 04/17/18   Shawnee Knapp, MD    Allergies    Patient has no known allergies.  Review of Systems   Review of Systems  Constitutional: Negative for chills and fever.  Respiratory: Negative for shortness of breath.   Cardiovascular: Negative for chest pain.  Gastrointestinal: Positive for abdominal pain, nausea and vomiting. Negative for anal bleeding, blood in stool, constipation and diarrhea.  Genitourinary: Negative for dysuria, vaginal bleeding and vaginal discharge.  Neurological: Negative for syncope.  All other systems reviewed and are negative.   Physical Exam Updated Vital Signs BP (!) 139/93 (BP Location: Right Arm)   Pulse 78   Temp 99.1 F (37.3 C) (Oral)   Resp 18   Ht 5\' 5"  (1.651 m)   Wt 99.8 kg  LMP 10/29/2019   SpO2 98%   BMI 36.61 kg/m   Physical Exam Vitals and nursing note reviewed.  Constitutional:      General: She is not in acute distress.    Appearance: She is well-developed. She is not toxic-appearing.  HENT:     Head: Normocephalic and atraumatic.  Eyes:     General:        Right eye: No discharge.        Left eye: No discharge.     Conjunctiva/sclera: Conjunctivae normal.  Cardiovascular:     Rate and Rhythm: Normal rate and regular rhythm.  Pulmonary:     Effort: Pulmonary effort is normal. No respiratory distress.     Breath sounds: Normal breath sounds. No wheezing, rhonchi or rales.  Abdominal:     General: There is no distension.     Palpations: Abdomen is  soft.     Tenderness: There is abdominal tenderness (w/ increased focality in the RLQ) in the right upper quadrant, right lower quadrant and epigastric area. There is no right CVA tenderness, left CVA tenderness, guarding or rebound. Negative signs include Rovsing's sign.  Musculoskeletal:     Cervical back: Neck supple.  Skin:    General: Skin is warm and dry.     Findings: No rash.  Neurological:     Mental Status: She is alert.     Comments: Clear speech.   Psychiatric:        Behavior: Behavior normal.    ED Results / Procedures / Treatments   Labs (all labs ordered are listed, but only abnormal results are displayed) Labs Reviewed  COMPREHENSIVE METABOLIC PANEL - Abnormal; Notable for the following components:      Result Value   Glucose, Bld 122 (*)    All other components within normal limits  CBC - Abnormal; Notable for the following components:   WBC 11.1 (*)    All other components within normal limits  URINALYSIS, ROUTINE W REFLEX MICROSCOPIC - Abnormal; Notable for the following components:   Hgb urine dipstick MODERATE (*)    Protein, ur 30 (*)    All other components within normal limits  LIPASE, BLOOD  I-STAT BETA HCG BLOOD, ED ([facility], [facility], [facility] ONLY)    EKG None  Radiology CT Abdomen Pelvis W Contrast  Result Date: 11/03/2019 CLINICAL DATA:  Generalized abdominal pain, emesis EXAM: CT ABDOMEN AND PELVIS WITH CONTRAST TECHNIQUE: Multidetector CT imaging of the abdomen and pelvis was performed using the standard protocol following bolus administration of intravenous contrast. CONTRAST:  160mL OMNIPAQUE IOHEXOL 300 MG/ML  SOLN COMPARISON:  None. FINDINGS: Lower chest: Trace bilateral pleural effusions and associated atelectasis or consolidation Hepatobiliary: No solid liver abnormality is seen. Gallbladder wall thickening and adjacent fat stranding (series 3, image 33). No radiopaque gallstones or biliary ductal dilatation. Pancreas: Unremarkable. No pancreatic ductal  dilatation or surrounding inflammatory changes. Spleen: Normal in size without significant abnormality. Adrenals/Urinary Tract: Adrenal glands are unremarkable. Left parapelvic cyst. No hydronephrosis. Bladder is unremarkable. Stomach/Bowel: Stomach is within normal limits. Appendix appears normal. No evidence of bowel wall thickening, distention, or inflammatory changes. Vascular/Lymphatic: No significant vascular findings are present. No enlarged abdominal or pelvic lymph nodes. Reproductive: Calcified uterine fibroids. Other: No abdominal wall hernia or abnormality. No abdominopelvic ascites. Musculoskeletal: No acute or significant osseous findings. IMPRESSION: 1. Gallbladder wall thickening and adjacent fat stranding, concerning for acute cholecystitis. No radiopaque gallstones or biliary ductal dilatation. Consider ultrasound and/or MRCP to further evaluate. 2. Trace  bilateral pleural effusions and associated atelectasis or consolidation. 3. Uterine fibroids. Electronically Signed   By: Eddie Candle M.D.   On: 11/03/2019 13:37   US Abdomen Limited RUQ  Result Date: 11/03/2019 CLINICAL DATA:  Abdomen pain since 3 a.m. EXAM: ULTRASOUND ABDOMEN LIMITED RIGHT UPPER QUADRANT COMPARISON:  11/03/2019 FINDINGS: Gallbladder: Multiple shadowing gallstones are seen within the gallbladder, largest measuring 10 mm. There is gallbladder wall thickening with intramural edema and pericholecystic fluid. Gallbladder wall measures up to 8 mm. Common bile duct: Diameter: 4 mm Liver: No focal lesion identified. Within normal limits in parenchymal echogenicity. Portal vein is patent on color Doppler imaging with normal direction of blood flow towards the liver. Other: None. IMPRESSION: 1. Cholelithiasis, with sonographic evidence of acute cholecystitis. Electronically Signed   By: Randa Ngo M.D.   On: 11/03/2019 15:08    Procedures Procedures (including critical care time)  Medications Ordered in ED Medications    cefTRIAXone (ROCEPHIN) 2 g in sodium chloride 0.9 % 100 mL IVPB (has no administration in time range)  ondansetron (ZOFRAN-ODT) disintegrating tablet 4 mg (4 mg Oral Given 11/02/19 1852)  sodium chloride 0.9 % bolus 1,000 mL (1,000 mLs Intravenous New Bag/Given 11/03/19 1002)  ondansetron (ZOFRAN) injection 4 mg (4 mg Intravenous Given 11/03/19 1003)  morphine 4 MG/ML injection 4 mg (4 mg Intravenous Given 11/03/19 1002)  iohexol (OMNIPAQUE) 300 MG/ML solution 100 mL (100 mLs Intravenous Contrast Given 11/03/19 1142)  morphine 4 MG/ML injection 4 mg (4 mg Intravenous Given 11/03/19 1403)    ED Course  I have reviewed the triage vital signs and the nursing notes.  Pertinent labs & imaging results that were available during my care of the patient were reviewed by me and considered in my medical decision making (see chart for details).  Clinical Course as of Nov 02 1349  Wed Nov 03, 8350  6917 54 year old female complaining of right-sided abdominal pain nausea vomiting starting around 3 AM.  No prior history of same.  Tender right upper quadrant on my exam.  CT suspicious for cholecystitis.  Getting ultrasound.  Disposition per results of testing.   [MB]    Clinical Course User Index [MB] Hayden Rasmussen, MD   MDM Rules/Calculators/A&P                         Patient presents to the ED with complaints of abdominal pain w/ N/V. Patient is nontoxic, vitals WNL with the exception of elevated BP- low suspicion for HTN emergency. On exam patient has primarily right sided abdominal tenderness without peritoneal signs.   Ddx: appendicitis, cholecystitis, cholelithiasis, cholangitis, perf, obstruction, viral GI illness, pancreatitis.   Additional history obtained:  Additional history obtained from chart & nursing note review.  Lab Tests:  I reviewed and interpreted labs, which included:  CBC: No significant anemia. Mild leukocytosis @ 11.1 CMP: Mild hyperglycemia @ 122, no acidosis or anion gap  elevation. No significant electrolyte derangement. LFTs & renal function WNL Lipase: WNL UA: Hematuria, no UTI.   09:05: Following initial assessment Morphine ordered for pain, zofran for nausea, & fluids for rehydration. Further assessment with CT A/P to be able to visualize gall bladder and appendix as patient is having RUQ and RLQ tenderness.   12:57: Contacted CT as patient's scan ended over an hour ago and no image is available for viewing there is an issue with images crossing over- working on this  Imaging Studies ordered:  I ordered imaging  studies which included CT A/P, I independently visualized and interpreted imaging which showed:   1. Gallbladder wall thickening and adjacent fat stranding, concerning for acute cholecystitis. No radiopaque gallstones or biliary ductal dilatation. Consider ultrasound and/or MRCP to further evaluate. 2. Trace bilateral pleural effusions and associated atelectasis or consolidation. 3. Uterine fibroids.  13:45: RE-EVAL: Pain at a 5/10 in severity, remains with RUQ tenderness. RUQ [place] & additional analgesics ordered.   [place]: Cholelithiasis, with sonographic evidence of acute cholecystitis.  Abx started, will discuss w/ general surgery 15:54: RE-EVAL: Patient feeling improved, states she does not need any further analgesics at this time, last PO intake > 24 hours ago, has had both doses of COVID 19 vaccine.   Findings and plan of care discussed with supervising physician Dr. Melina Copa who has evaluated patient as shared visit & is in agreement.   15:58: CONSULT: Discussed case with general surgeon Dr.Lovick, general surgery team to see in the ED.   Patient admitted to general surgery service.   Portions of this note were generated with [employer]. Dictation errors may occur despite best attempts at proofreading.  Final Clinical Impression(s) / ED Diagnoses Final diagnoses:  Abdominal pain  Cholecystitis    Rx / DC Orders ED Discharge  Orders    None       Leafy Kindle 11/03/19 1705    Hayden Rasmussen, MD 11/03/19 [place]

## 2019-11-04 ENCOUNTER — Encounter (HOSPITAL_COMMUNITY): Payer: Self-pay

## 2019-11-04 ENCOUNTER — Observation Stay (HOSPITAL_COMMUNITY): Payer: BC Managed Care – PPO | Admitting: Anesthesiology

## 2019-11-04 ENCOUNTER — Encounter (HOSPITAL_COMMUNITY)
Admission: EM | Disposition: A | Discharge status: Home / Self Care | Payer: Self-pay | Source: Home / Self Care | Attending: Emergency Medicine

## 2019-11-04 HISTORY — PX: CHOLECYSTECTOMY: SHX55

## 2019-11-04 LAB — CBC
HCT: 37 % (ref 36.0–46.0)
Hemoglobin: 12 g/dL (ref 12.0–15.0)
MCH: 28.6 pg (ref 26.0–34.0)
MCHC: 32.4 g/dL (ref 30.0–36.0)
MCV: 88.3 fL (ref 80.0–100.0)
Platelets: 251 10*3/uL (ref 150–400)
RBC: 4.19 MIL/uL (ref 3.87–5.11)
RDW: 13.4 % (ref 11.5–15.5)
WBC: 9.8 10*3/uL (ref 4.0–10.5)
nRBC: 0 % (ref 0.0–0.2)

## 2019-11-04 LAB — COMPREHENSIVE METABOLIC PANEL
ALT: 63 U/L — ABNORMAL HIGH (ref 0–44)
AST: 52 U/L — ABNORMAL HIGH (ref 15–41)
Albumin: 2.8 g/dL — ABNORMAL LOW (ref 3.5–5.0)
Alkaline Phosphatase: 83 U/L (ref 38–126)
Anion gap: 8 (ref 5–15)
BUN: 11 mg/dL (ref 6–20)
CO2: 25 mmol/L (ref 22–32)
Calcium: 8.6 mg/dL — ABNORMAL LOW (ref 8.9–10.3)
Chloride: 104 mmol/L (ref 98–111)
Creatinine, Ser: 0.88 mg/dL (ref 0.44–1.00)
GFR calc Af Amer: >60 mL/min (ref >60)
GFR calc non Af Amer: >60 mL/min (ref >60)
Glucose, Bld: 100 mg/dL — ABNORMAL HIGH (ref 70–99)
Potassium: 3.5 mmol/L (ref 3.5–5.1)
Sodium: 137 mmol/L (ref 135–145)
Total Bilirubin: 1 mg/dL (ref 0.3–1.2)
Total Protein: 5.9 g/dL — ABNORMAL LOW (ref 6.5–8.1)

## 2019-11-04 LAB — SURGICAL PCR SCREEN
MRSA, PCR: NEGATIVE
Staphylococcus aureus: NEGATIVE

## 2019-11-04 LAB — HIV ANTIBODY (ROUTINE TESTING W REFLEX): HIV Screen 4th Generation wRfx: NONREACTIVE

## 2019-11-04 SURGERY — LAPAROSCOPIC CHOLECYSTECTOMY
Anesthesia: General | Site: Abdomen

## 2019-11-04 MED ORDER — GLYCOPYRROLATE PF 0.2 MG/ML IJ SOSY
PREFILLED_SYRINGE | INTRAMUSCULAR | Status: DC | PRN
Start: 2019-11-04 — End: 2019-11-04
  Administered 2019-11-04: .2 mg via INTRAVENOUS

## 2019-11-04 MED ORDER — MIDAZOLAM HCL 2 MG/2ML IJ SOLN
INTRAMUSCULAR | Status: AC
  Filled 2019-11-04: qty 2

## 2019-11-04 MED ORDER — ONDANSETRON HCL 4 MG/2ML IJ SOLN
INTRAMUSCULAR | Status: DC | PRN
  Administered 2019-11-04: 4 mg via INTRAVENOUS

## 2019-11-04 MED ORDER — OXYCODONE HCL 5 MG/5ML PO SOLN: 5.0000 mg | Freq: Once | ORAL | Status: DC | PRN

## 2019-11-04 MED ORDER — LIDOCAINE 2% (20 MG/ML) 5 ML SYRINGE
INTRAMUSCULAR | Status: DC | PRN
  Administered 2019-11-04: 80 mg via INTRAVENOUS

## 2019-11-04 MED ORDER — ACETAMINOPHEN 10 MG/ML IV SOLN
INTRAVENOUS | Status: AC
  Filled 2019-11-04: qty 100

## 2019-11-04 MED ORDER — ROCURONIUM BROMIDE 10 MG/ML (PF) SYRINGE
PREFILLED_SYRINGE | INTRAVENOUS | Status: DC | PRN
  Administered 2019-11-04 (×2): 15 mg via INTRAVENOUS
  Administered 2019-11-04: 10 mg via INTRAVENOUS
  Administered 2019-11-04: 50 mg via INTRAVENOUS

## 2019-11-04 MED ORDER — BUPIVACAINE HCL (PF) 0.25 % IJ SOLN
INTRAMUSCULAR | Status: AC
  Filled 2019-11-04: qty 30

## 2019-11-04 MED ORDER — SODIUM CHLORIDE 0.9 % IR SOLN
Status: DC | PRN
  Administered 2019-11-04: 1

## 2019-11-04 MED ORDER — EPHEDRINE SULFATE-NACL 50-0.9 MG/10ML-% IV SOSY
PREFILLED_SYRINGE | INTRAVENOUS | Status: DC | PRN
  Administered 2019-11-04: 10 mg via INTRAVENOUS

## 2019-11-04 MED ORDER — DROPERIDOL 2.5 MG/ML IJ SOLN: 0.6250 mg | Freq: Once | INTRAMUSCULAR | Status: DC | PRN

## 2019-11-04 MED ORDER — OXYCODONE HCL 5 MG PO TABS: 5.0000 mg | ORAL_TABLET | Freq: Once | ORAL | Status: DC | PRN

## 2019-11-04 MED ORDER — BUPIVACAINE HCL 0.25 % IJ SOLN
INTRAMUSCULAR | Status: DC | PRN
  Administered 2019-11-04: 30 mL

## 2019-11-04 MED ORDER — LIDOCAINE 2% (20 MG/ML) 5 ML SYRINGE
INTRAMUSCULAR | Status: AC
  Filled 2019-11-04: qty 5

## 2019-11-04 MED ORDER — LACTATED RINGERS IV SOLN: INTRAVENOUS | Status: DC

## 2019-11-04 MED ORDER — POLYETHYLENE GLYCOL 3350 17 G PO PACK: 17.0000 g | PACK | Freq: Every day | ORAL | Status: DC | PRN

## 2019-11-04 MED ORDER — 0.9 % SODIUM CHLORIDE (POUR BTL) OPTIME
TOPICAL | Status: DC | PRN
  Administered 2019-11-04: 1000 mL

## 2019-11-04 MED ORDER — SUGAMMADEX SODIUM 200 MG/2ML IV SOLN
INTRAVENOUS | Status: DC | PRN
  Administered 2019-11-04: 200 mg via INTRAVENOUS

## 2019-11-04 MED ORDER — ONDANSETRON HCL 4 MG/2ML IJ SOLN
INTRAMUSCULAR | Status: AC
  Filled 2019-11-04: qty 2

## 2019-11-04 MED ORDER — LACTATED RINGERS IV SOLN: INTRAVENOUS | Status: DC | PRN

## 2019-11-04 MED ORDER — HYDROMORPHONE HCL 1 MG/ML IJ SOLN
0.2500 mg | INTRAMUSCULAR | Status: DC | PRN
  Administered 2019-11-04 (×2): 0.5 mg via INTRAVENOUS

## 2019-11-04 MED ORDER — MORPHINE SULFATE (PF) 2 MG/ML IV SOLN
2.0000 mg | INTRAVENOUS | Status: DC | PRN
  Administered 2019-11-04: 2 mg via INTRAVENOUS
  Filled 2019-11-04: qty 1

## 2019-11-04 MED ORDER — PROPOFOL 10 MG/ML IV BOLUS
INTRAVENOUS | Status: AC
  Filled 2019-11-04: qty 20

## 2019-11-04 MED ORDER — DEXMEDETOMIDINE HCL 200 MCG/2ML IV SOLN
INTRAVENOUS | Status: DC | PRN
  Administered 2019-11-04: 12 ug via INTRAVENOUS

## 2019-11-04 MED ORDER — ACETAMINOPHEN 10 MG/ML IV SOLN
1000.0000 mg | Freq: Once | INTRAVENOUS | Status: DC | PRN
  Administered 2019-11-04: 1000 mg via INTRAVENOUS

## 2019-11-04 MED ORDER — DEXMEDETOMIDINE (PRECEDEX) IN NS 20 MCG/5ML (4 MCG/ML) IV SYRINGE
PREFILLED_SYRINGE | INTRAVENOUS | Status: AC
  Filled 2019-11-04: qty 5

## 2019-11-04 MED ORDER — KETOROLAC TROMETHAMINE 15 MG/ML IJ SOLN: 15.0000 mg | Freq: Four times a day (QID) | INTRAMUSCULAR | Status: DC | PRN

## 2019-11-04 MED ORDER — MIDAZOLAM HCL 5 MG/5ML IJ SOLN
INTRAMUSCULAR | Status: DC | PRN
  Administered 2019-11-04: 2 mg via INTRAVENOUS

## 2019-11-04 MED ORDER — ONDANSETRON HCL 4 MG/2ML IJ SOLN: 4.0000 mg | Freq: Once | INTRAMUSCULAR | Status: DC | PRN

## 2019-11-04 MED ORDER — PROPOFOL 10 MG/ML IV BOLUS
INTRAVENOUS | Status: DC | PRN
  Administered 2019-11-04: 110 mg via INTRAVENOUS

## 2019-11-04 MED ORDER — PHENYLEPHRINE 40 MCG/ML (10ML) SYRINGE FOR IV PUSH (FOR BLOOD PRESSURE SUPPORT)
PREFILLED_SYRINGE | INTRAVENOUS | Status: AC
  Filled 2019-11-04: qty 10

## 2019-11-04 MED ORDER — ROCURONIUM BROMIDE 10 MG/ML (PF) SYRINGE
PREFILLED_SYRINGE | INTRAVENOUS | Status: AC
  Filled 2019-11-04: qty 10

## 2019-11-04 MED ORDER — VASOPRESSIN 20 UNIT/ML IV SOLN
INTRAVENOUS | Status: AC
  Filled 2019-11-04: qty 1

## 2019-11-04 MED ORDER — DEXAMETHASONE SODIUM PHOSPHATE 10 MG/ML IJ SOLN
INTRAMUSCULAR | Status: DC | PRN
  Administered 2019-11-04: 10 mg via INTRAVENOUS

## 2019-11-04 MED ORDER — HYDROMORPHONE HCL 1 MG/ML IJ SOLN
INTRAMUSCULAR | Status: AC
  Filled 2019-11-04: qty 1

## 2019-11-04 MED ORDER — FENTANYL CITRATE (PF) 100 MCG/2ML IJ SOLN
INTRAMUSCULAR | Status: DC | PRN
  Administered 2019-11-04: 100 ug via INTRAVENOUS
  Administered 2019-11-04 (×3): 50 ug via INTRAVENOUS

## 2019-11-04 MED ORDER — DEXAMETHASONE SODIUM PHOSPHATE 10 MG/ML IJ SOLN
INTRAMUSCULAR | Status: AC
  Filled 2019-11-04: qty 1

## 2019-11-04 MED ORDER — FENTANYL CITRATE (PF) 250 MCG/5ML IJ SOLN
INTRAMUSCULAR | Status: AC
  Filled 2019-11-04: qty 5

## 2019-11-04 SURGICAL SUPPLY — 42 items
APPLIER CLIP 5 13 M/L LIGAMAX5 (MISCELLANEOUS) ×3
APR CLP MED LRG 5 ANG JAW (MISCELLANEOUS) ×1
BLADE CLIPPER SURG (BLADE) IMPLANT
CANISTER SUCT 3000ML PPV (MISCELLANEOUS) ×3 IMPLANT
CHLORAPREP W/TINT 26 (MISCELLANEOUS) ×3 IMPLANT
CLIP APPLIE 5 13 M/L LIGAMAX5 (MISCELLANEOUS) ×1 IMPLANT
COVER SURGICAL LIGHT HANDLE (MISCELLANEOUS) ×3 IMPLANT
DERMABOND ADVANCED (GAUZE/BANDAGES/DRESSINGS) ×2
DERMABOND ADVANCED .7 DNX12 (GAUZE/BANDAGES/DRESSINGS) ×1 IMPLANT
DISSECTOR BLUNT TIP ENDO 5MM (MISCELLANEOUS) IMPLANT
ELECT CAUTERY BLADE 6.4 (BLADE) ×3 IMPLANT
ELECT REM PT RETURN 9FT ADLT (ELECTROSURGICAL) ×3
ELECTRODE REM PT RTRN 9FT ADLT (ELECTROSURGICAL) ×1 IMPLANT
GLOVE BIO SURGEON STRL SZ 6.5 (GLOVE) ×2 IMPLANT
GLOVE BIO SURGEONS STRL SZ 6.5 (GLOVE) ×1
GLOVE BIOGEL PI IND STRL 6 (GLOVE) ×1 IMPLANT
GLOVE BIOGEL PI INDICATOR 6 (GLOVE) ×2
GOWN STRL REUS W/ TWL LRG LVL3 (GOWN DISPOSABLE) ×4 IMPLANT
GOWN STRL REUS W/TWL LRG LVL3 (GOWN DISPOSABLE) ×12
GRASPER SUT TROCAR 14GX15 (MISCELLANEOUS) ×3 IMPLANT
KIT BASIN OR (CUSTOM PROCEDURE TRAY) ×3 IMPLANT
KIT TURNOVER KIT B (KITS) ×3 IMPLANT
NS IRRIG 1000ML POUR BTL (IV SOLUTION) ×3 IMPLANT
PAD ARMBOARD 7.5X6 YLW CONV (MISCELLANEOUS) ×3 IMPLANT
PENCIL BUTTON HOLSTER BLD 10FT (ELECTRODE) ×3 IMPLANT
POUCH RETRIEVAL ECOSAC 10 (ENDOMECHANICALS) ×1 IMPLANT
POUCH RETRIEVAL ECOSAC 10MM (ENDOMECHANICALS) ×3
POUCH SPECIMEN RETRIEVAL 10MM (ENDOMECHANICALS) ×3 IMPLANT
SCISSORS LAP 5X35 DISP (ENDOMECHANICALS) ×3 IMPLANT
SET IRRIG TUBING LAPAROSCOPIC (IRRIGATION / IRRIGATOR) ×3 IMPLANT
SET TUBE SMOKE EVAC HIGH FLOW (TUBING) ×3 IMPLANT
SLEEVE ENDOPATH XCEL 5M (ENDOMECHANICALS) ×6 IMPLANT
SPECIMEN JAR SMALL (MISCELLANEOUS) ×3 IMPLANT
SUT MNCRL AB 4-0 PS2 18 (SUTURE) ×9 IMPLANT
SUT VICRYL 0 AB UR-6 (SUTURE) IMPLANT
SUT VICRYL 0 UR6 27IN ABS (SUTURE) ×6 IMPLANT
TOWEL GREEN STERILE (TOWEL DISPOSABLE) ×3 IMPLANT
TOWEL GREEN STERILE FF (TOWEL DISPOSABLE) ×3 IMPLANT
TRAY LAPAROSCOPIC MC (CUSTOM PROCEDURE TRAY) ×3 IMPLANT
TROCAR XCEL BLUNT TIP 100MML (ENDOMECHANICALS) ×6 IMPLANT
TROCAR XCEL NON-BLD 5MMX100MML (ENDOMECHANICALS) ×3 IMPLANT
WATER STERILE IRR 1000ML POUR (IV SOLUTION) ×3 IMPLANT

## 2019-11-04 NOTE — Op Note (Signed)
   Operative Note  Date: 11/04/2019  Procedure: laparoscopic cholecystectomy  Pre-op diagnosis: acute cholecystitis Post-op diagnosis: same  Indication and clinical history: The patient is a 54 y.o. year old female with acute cholecystitis after one day of pain, first episode.  Surgeon: Jesusita Oka, MD Assistant: Ileene Rubens, [place]  Anesthesiologist: Valma Cava, MD Anesthesia: General  Findings:  . Specimen: gallbladder . EBL: <10cc . Drains/Implants: none  Disposition: PACU - hemodynamically stable.  Description of procedure: The patient was positioned supine on the operating room table. Time-out was performed verifying correct patient, procedure, signature of informed consent, and administration of pre-operative antibiotics. General anesthetic induction and intubation were uneventful. The abdomen was prepped and draped in the usual sterile fashion. An infra-umbilical incision was made using an open technique using zero vicryl stay sutures on either side of the fascia and a 71mm Hassan port inserted. After establishing pneumoperitoneum, which the patient tolerated well, the abdominal cavity was inspected and no injury of any intra-abdominal structures was identified. Additional ports were placed under direct visualization and using local anesthetic: two 22mm ports in the right subcostal region and a 65mm port in the epigastric region. The patient was re-positioned to reverse Trendelenburg and right side up. Adhesiolysis was performed to expose the gallbladder, which was then retracted cephalad. The gallbladder was tense and unable to be grasped until it was aspirated. The infundibulum was identified and retracted toward the right lower quadrant. The peritoneum was incised over the infundibulum and the triangle of Calot dissected. The cystic duct was clearly visualized as a single structure entering the gallbladder and was doubly clipped and diveded. During dissection off the liver bed, a diminutive  cystic artery was identified and coagulated. The gallbladder was dissected off the liver bed using electrocautery and hemostasis of the liver bed was confirmed prior to separation of the final peritoneal attachments of the gallbladder to the liver bed. There was spillage of numerous gallstones during dissection. After transection of the final peritoneal attachments, the gallbladder was placed in an endoscopic specimen retrieval bag. During attempted removal via the umbilical port site, the bag partially ruptured and the epigastric port was upsized to a 12 to allow placement of a second bag and removal of the gallbladder. This port site had to be significantly extended in order to extract the gallbladder and after removal, was sent to pathology as a permanent specimen. All visible gallstones were retrieved. The gallbladder fossa was irrigated and fluid returned clear. The abdomen was desufflated and the fascia of the umbilical port site and at the epigastrium were closed with zero vicryl suture. Additional local anesthetic was administered at the umbilical and epigastric sites. The skin of all incisions was closed with 4-0 monocryl. Sterile dressings were applied with dermabond. All sponge and instrument counts were correct at the conclusion of the procedure. The patient was awakened from anesthesia, extubated uneventfully, and transported to the PACU - hemodynamically stable.. There were no complications.     Jesusita Oka, MD General and [place] [facility]

## 2019-11-04 NOTE — Anesthesia Postprocedure Evaluation (Signed)
Anesthesia Post Note  Patient: Megan Gardner  Procedure(s) Performed: LAPAROSCOPIC CHOLECYSTECTOMY (N/A Abdomen)     Patient location during evaluation: PACU Anesthesia Type: General Level of consciousness: awake and alert Pain management: pain level controlled Vital Signs Assessment: post-procedure vital signs reviewed and stable Respiratory status: spontaneous breathing, nonlabored ventilation, respiratory function stable and patient connected to nasal cannula oxygen Cardiovascular status: blood pressure returned to baseline and stable Postop Assessment: no apparent nausea or vomiting Anesthetic complications: no   No complications documented.  Last Vitals:  Vitals:   11/04/19 1110 11/04/19 1138  BP: 124/81 (!) 141/88  Pulse: 83 84  Resp: 16 10  Temp:  37.1 C  SpO2: 99% 97%    Last Pain:  Vitals:   11/04/19 1138  TempSrc: Oral  PainSc:                  Barnet Glasgow

## 2019-11-04 NOTE — Progress Notes (Signed)
General Surgery Follow Up Note  Subjective:    Overnight Issues:   Objective:  Vital signs for last 24 hours: Temp:  [97.4 F (36.3 C)-98.9 F (37.2 C)] 98.5 F (36.9 C) (07/15 0520) Pulse Rate:  [74-92] 85 (07/15 0520) Resp:  [16-18] 17 (07/15 0520) BP: (121-142)/(79-94) 126/79 (07/15 0520) SpO2:  [94 %-100 %] 99 % (07/15 0520) Weight:  [103.6 kg] 103.6 kg (07/14 2007)  Hemodynamic parameters for last 24 hours:    Intake/Output from previous day: 07/14 0701 - 07/15 0700 In: 1250.9 [I.V.:150.9; IV Piggyback:1100] Out: -   Intake/Output this shift: Total I/O In: 1200 [I.V.:1200] Out: 10 [Blood:10]  Vent settings for last 24 hours:    Physical Exam:  Gen: comfortable, no distress Neuro: non-focal exam HEENT: PERRL Neck: supple CV: RRR Pulm: unlabored breathing Abd: soft, RUQ TTP GU: clear yellow urine Extr: wwp, no edema   Results for orders placed or performed during the hospital encounter of 11/02/19 (from the past 24 hour(s))  SARS Coronavirus 2 by RT PCR (hospital order, performed in [place] hospital lab) Nasopharyngeal Nasopharyngeal Swab     Status: None   Collection Time: 11/03/19  4:36 PM   Specimen: Nasopharyngeal Swab  Result Value Ref Range   SARS Coronavirus 2 NEGATIVE NEGATIVE  Surgical PCR screen     Status: None   Collection Time: 11/03/19 11:54 PM   Specimen: Nasal Mucosa; Nasal Swab  Result Value Ref Range   MRSA, PCR NEGATIVE NEGATIVE   Staphylococcus aureus NEGATIVE NEGATIVE  Comprehensive metabolic panel     Status: Abnormal   Collection Time: 11/04/19  3:40 AM  Result Value Ref Range   Sodium 137 135 - 145 mmol/L   Potassium 3.5 3.5 - 5.1 mmol/L   Chloride 104 98 - 111 mmol/L   CO2 25 22 - 32 mmol/L   Glucose, Bld 100 (H) 70 - 99 mg/dL   BUN 11 6 - 20 mg/dL   Creatinine, Ser 0.88 0.44 - 1.00 mg/dL   Calcium 8.6 (L) 8.9 - 10.3 mg/dL   Total Protein 5.9 (L) 6.5 - 8.1 g/dL   Albumin 2.8 (L) 3.5 - 5.0 g/dL   AST 52 (H) 15 -  41 U/L   ALT 63 (H) 0 - 44 U/L   Alkaline Phosphatase 83 38 - 126 U/L   Total Bilirubin 1.0 0.3 - 1.2 mg/dL   GFR calc non Af Amer >60 >60 mL/min   GFR calc Af Amer >60 >60 mL/min   Anion gap 8 5 - 15  CBC     Status: None   Collection Time: 11/04/19  3:40 AM  Result Value Ref Range   WBC 9.8 4.0 - 10.5 K/uL   RBC 4.19 3.87 - 5.11 MIL/uL   Hemoglobin 12.0 12.0 - 15.0 g/dL   HCT 37.0 36 - 46 %   MCV 88.3 80.0 - 100.0 fL   MCH 28.6 26.0 - 34.0 pg   MCHC 32.4 30.0 - 36.0 g/dL   RDW 13.4 11.5 - 15.5 %   Platelets 251 150 - 400 K/uL   nRBC 0.0 0.0 - 0.2 %    Assessment & Plan:  Present on Admission: . Cholecystitis    LOS: 0 days   Additional comments:I reviewed the patient's new clinical lab test results.    Acute cholecystitis - lap chole today. Informed consent was obtained after detailed explanation of risks, including bleeding, infection, biloma, need for IOC to delineate anatomy, and need for conversion to  open procedure. All questions answered to the patient's satisfaction. Spoke with the patient's mother pre-op.  FEN - NPO , diet post-op DVT - SCDs, LMWH Dispo -  OR   Jesusita Oka, MD Trauma & General Surgery Please use [email or web address] to contact on call provider  11/04/2019  *Care during the described time interval was provided by me. I have reviewed this patient's available data, including medical history, events of note, physical examination and test results as part of my evaluation.

## 2019-11-04 NOTE — Transfer of Care (Signed)
Immediate Anesthesia Transfer of Care Note  Patient: Megan Gardner  Procedure(s) Performed: LAPAROSCOPIC CHOLECYSTECTOMY (N/A Abdomen)  Patient Location: PACU  Anesthesia Type:General  Level of Consciousness: awake and alert   Airway & Oxygen Therapy: Patient Spontanous Breathing and Patient connected to nasal cannula oxygen  Post-op Assessment: Report given to RN, Post -op Vital signs reviewed and stable and Patient moving all extremities X 4  Post vital signs: Reviewed and stable  Last Vitals:  Vitals Value Taken Time  BP    Temp    Pulse    Resp    SpO2      Last Pain:  Vitals:   11/03/19 2000  TempSrc:   PainSc: 3       Patients Stated Pain Goal: 0 (04/59/13 6859)  Complications: No complications documented.

## 2019-11-04 NOTE — Anesthesia Procedure Notes (Signed)
Procedure Name: Intubation Date/Time: 11/04/2019 7:52 AM Performed by: Inda Coke, CRNA Pre-anesthesia Checklist: Patient identified, Emergency Drugs available, Suction available and Patient being monitored Patient Re-evaluated:Patient Re-evaluated prior to induction Oxygen Delivery Method: Circle System Utilized Preoxygenation: Pre-oxygenation with 100% oxygen Induction Type: IV induction Ventilation: Mask ventilation without difficulty and Oral airway inserted - appropriate to patient size Laryngoscope Size: Mac and 3 Grade View: Grade I Tube type: Oral Tube size: 7.0 mm Number of attempts: 1 Airway Equipment and Method: Stylet and Oral airway Placement Confirmation: ETT inserted through vocal cords under direct vision,  positive ETCO2 and breath sounds checked- equal and bilateral Secured at: 22 cm Tube secured with: Tape Dental Injury: Teeth and Oropharynx as per pre-operative assessment

## 2019-11-05 ENCOUNTER — Encounter (HOSPITAL_COMMUNITY): Payer: Self-pay | Admitting: Surgery

## 2019-11-05 LAB — CBC
HCT: 35.7 % — ABNORMAL LOW (ref 36.0–46.0)
Hemoglobin: 11.5 g/dL — ABNORMAL LOW (ref 12.0–15.0)
MCH: 28.3 pg (ref 26.0–34.0)
MCHC: 32.2 g/dL (ref 30.0–36.0)
MCV: 87.9 fL (ref 80.0–100.0)
Platelets: 265 10*3/uL (ref 150–400)
RBC: 4.06 MIL/uL (ref 3.87–5.11)
RDW: 13.3 % (ref 11.5–15.5)
WBC: 11.4 10*3/uL — ABNORMAL HIGH (ref 4.0–10.5)
nRBC: 0 % (ref 0.0–0.2)

## 2019-11-05 LAB — SURGICAL PATHOLOGY

## 2019-11-05 LAB — COMPREHENSIVE METABOLIC PANEL
ALT: 88 U/L — ABNORMAL HIGH (ref 0–44)
AST: 79 U/L — ABNORMAL HIGH (ref 15–41)
Albumin: 2.7 g/dL — ABNORMAL LOW (ref 3.5–5.0)
Alkaline Phosphatase: 83 U/L (ref 38–126)
Anion gap: 8 (ref 5–15)
BUN: 10 mg/dL (ref 6–20)
CO2: 25 mmol/L (ref 22–32)
Calcium: 8.8 mg/dL — ABNORMAL LOW (ref 8.9–10.3)
Chloride: 103 mmol/L (ref 98–111)
Creatinine, Ser: 0.88 mg/dL (ref 0.44–1.00)
GFR calc Af Amer: >60 mL/min (ref >60)
GFR calc non Af Amer: >60 mL/min (ref >60)
Glucose, Bld: 125 mg/dL — ABNORMAL HIGH (ref 70–99)
Potassium: 3.6 mmol/L (ref 3.5–5.1)
Sodium: 136 mmol/L (ref 135–145)
Total Bilirubin: 0.7 mg/dL (ref 0.3–1.2)
Total Protein: 5.9 g/dL — ABNORMAL LOW (ref 6.5–8.1)

## 2019-11-05 MED ORDER — OXYCODONE HCL 5 MG PO TABS: 5.0000 mg | ORAL_TABLET | Freq: Four times a day (QID) | ORAL | 0 refills | Status: DC | PRN

## 2019-11-05 MED ORDER — ACETAMINOPHEN 500 MG PO TABS: 1000.0000 mg | ORAL_TABLET | Freq: Four times a day (QID) | ORAL | Status: DC | PRN

## 2019-11-05 NOTE — Plan of Care (Signed)

## 2019-11-05 NOTE — Discharge Summary (Signed)
[facility] Discharge Summary   Patient ID: Megan Gardner MRN: [id number] DOB/AGE: January 06, 1966 54 y.o.  Admit date: 11/02/2019 Discharge date: 11/05/2019  Admitting Diagnosis: Acute cholecystitis   Discharge Diagnosis Acute cholecystitis   Consultants None   Imaging: US Abdomen Limited RUQ  Result Date: 11/03/2019 CLINICAL DATA:  Abdomen pain since 3 a.m. EXAM: ULTRASOUND ABDOMEN LIMITED RIGHT UPPER QUADRANT COMPARISON:  11/03/2019 FINDINGS: Gallbladder: Multiple shadowing gallstones are seen within the gallbladder, largest measuring 10 mm. There is gallbladder wall thickening with intramural edema and pericholecystic fluid. Gallbladder wall measures up to 8 mm. Common bile duct: Diameter: 4 mm Liver: No focal lesion identified. Within normal limits in parenchymal echogenicity. Portal vein is patent on color Doppler imaging with normal direction of blood flow towards the liver. Other: None. IMPRESSION: 1. Cholelithiasis, with sonographic evidence of acute cholecystitis. Electronically Signed   By: Randa Ngo M.D.   On: 11/03/2019 15:08    Procedures Dr. Bobbye Morton (11/04/19) - Laparoscopic Cholecystectomy   Hospital Course:  Patient is a 54 year old female who presented to [facility] with abdominal pain.  Workup showed acute cholecystitis.  Patient was admitted and underwent procedure listed above.  Tolerated procedure well and was transferred to the floor.  Diet was advanced as tolerated.  On POD#1, the patient was voiding well, tolerating diet, ambulating well, pain well controlled, vital signs stable, incisions c/d/i and felt stable for discharge home.  Patient will follow up in our office in 3 weeks and knows to call with questions or concerns.  She will call to confirm appointment date/time.    Physical Exam: General:  Alert, NAD, pleasant, comfortable Abd:  Soft, ND, mild tenderness, incisions C/D/I  I or a member of my team have reviewed this patient in the Controlled  Substance Database.   Allergies as of 11/05/2019   No Known Allergies     Medication List    TAKE these medications   acetaminophen 500 MG tablet Commonly known as: TYLENOL Take 2 tablets (1,000 mg total) by mouth every 6 (six) hours as needed for mild pain or fever.   cholecalciferol 1000 units tablet Commonly known as: VITAMIN D Take 1,000 Units by mouth daily.   multivitamin tablet Take 1 tablet by mouth daily.   mupirocin ointment 2 % Commonly known as: BACTROBAN Place 1 application into the nose 2 (two) times daily. X 1 week   oxyCODONE 5 MG immediate release tablet Commonly known as: Oxy IR/ROXICODONE Take 1-2 tablets (5-10 mg total) by mouth every 6 (six) hours as needed for moderate pain or severe pain.         Follow-up [facility], [place]. Go on 11/23/2019.   Specialty: General Surgery Why: Your appointment is 08/03 at 8:30am Please arrive 30 minutes prior to your appointment to check in and fill out paperwork. Bring photo ID and Doctor, general practice information: [address] [place] [phone]              Signed: Norm Parcel , [facility] 11/05/2019, 2:04 PM Please see Amion for pager number during day hours 7:00am-4:30pm

## 2019-11-05 NOTE — Plan of Care (Signed)
  Problem: Activity: Goal: Risk for activity intolerance will decrease Outcome: Progressing   

## 2020-07-14 IMAGING — CT CT NECK W/ CM
3 of 4 series · 13 of 33 positions shown, 16 images · IV contrast (Omni 300)
Comparison: None.

Addendum:
CLINICAL DATA: Facial swelling and neck pain

EXAM:
CT NECK WITH CONTRAST
TECHNIQUE: Multidetector CT imaging of the neck was performed using the
standard protocol following the bolus administration of intravenous
contrast.
CONTRAST:  75mL OMNIPAQUE IOHEXOL 300 MG/ML  SOLN

[Series 3: neck 2.0 st · axial · 0.47mm/px · z∈[-282,-104]mm · 5 of 135 slices shown, 7 images (1 of 3)]
[im 23/135  soft-tissue]
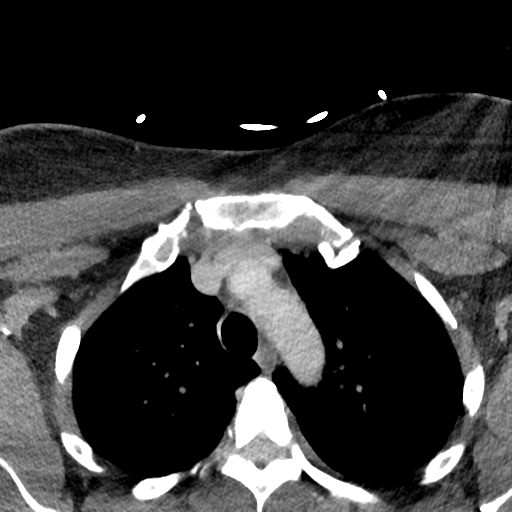
[im 23/135  bone]
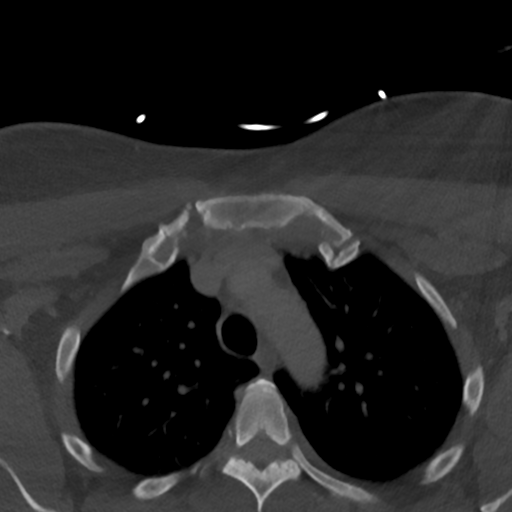
[im 45/135  bone]
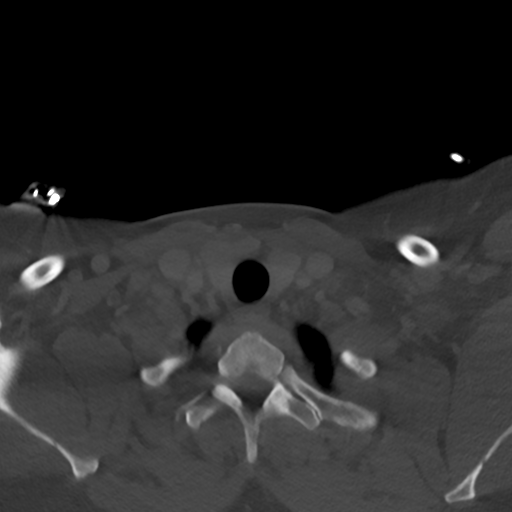
[im 68/135  bone]
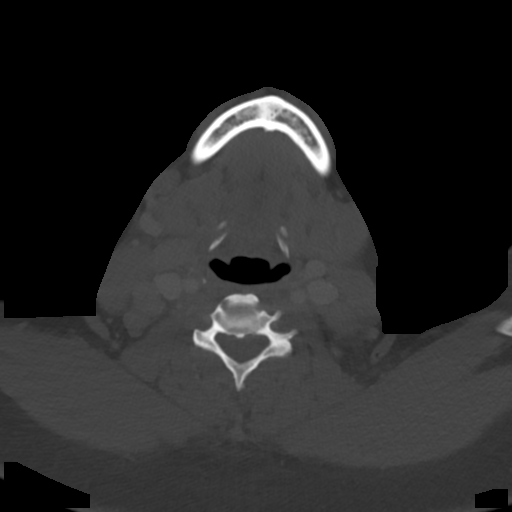
[im 90/135  bone]
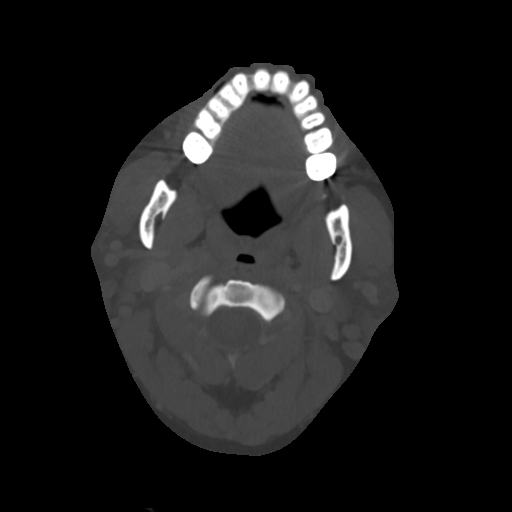
[im 112/135  soft-tissue]
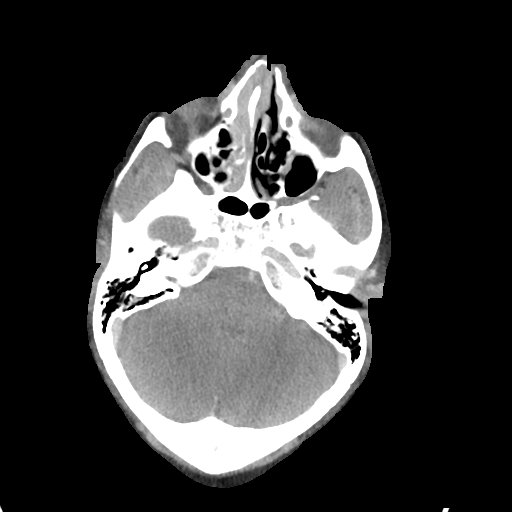
[im 112/135  bone]
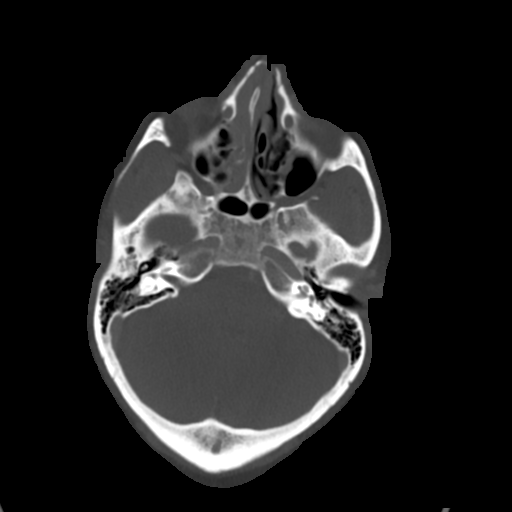

[Series 5: neck 2.0 st · sagittal · 0.53mm/px · 5 of 101 slices shown, 6 images (2 of 3)]
[im 34/101  bone]
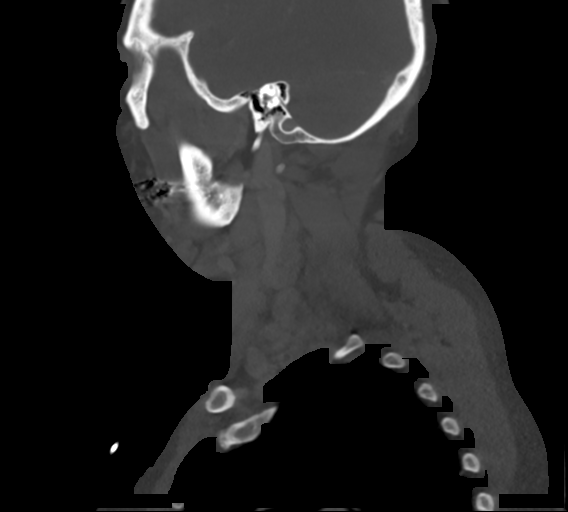
[im 42/101  bone]
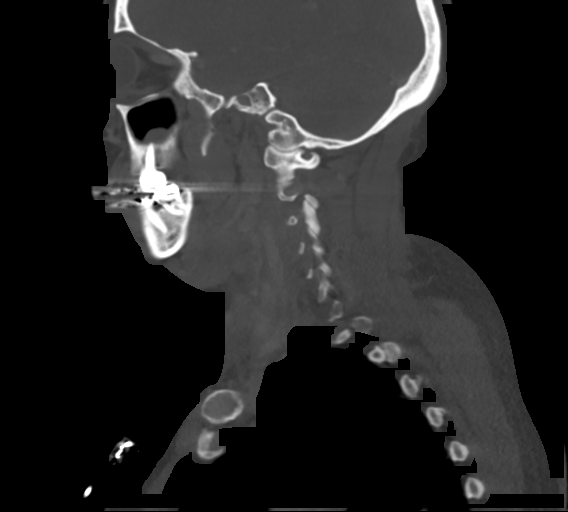
[im 51/101  soft-tissue]
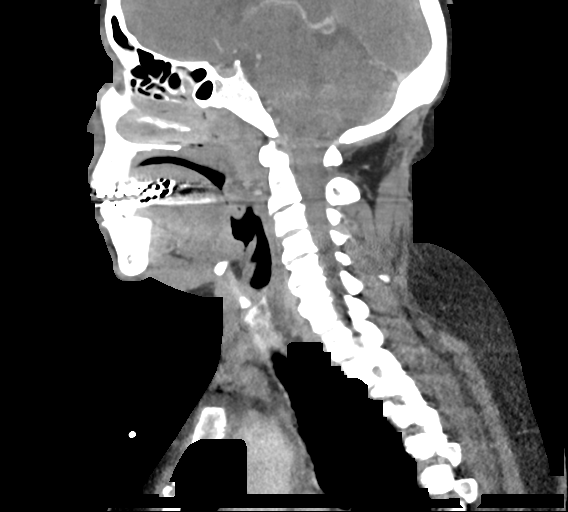
[im 51/101  bone]
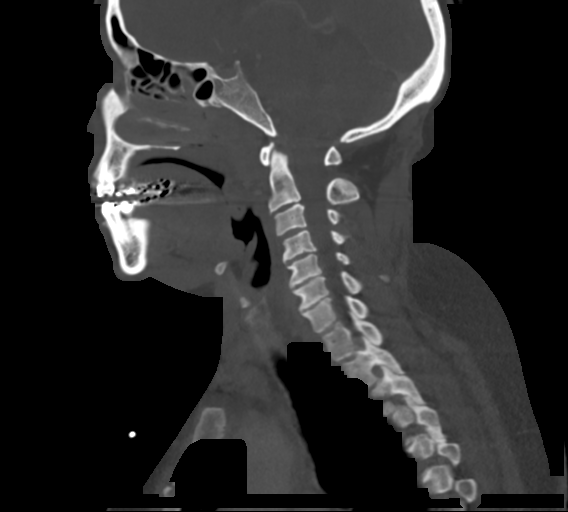
[im 59/101  bone]
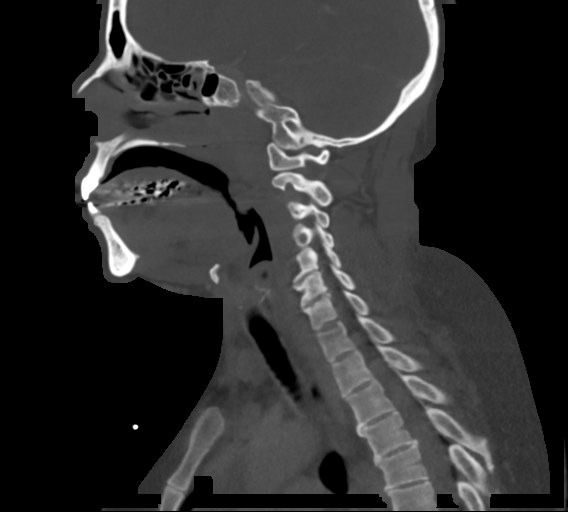
[im 67/101  bone]
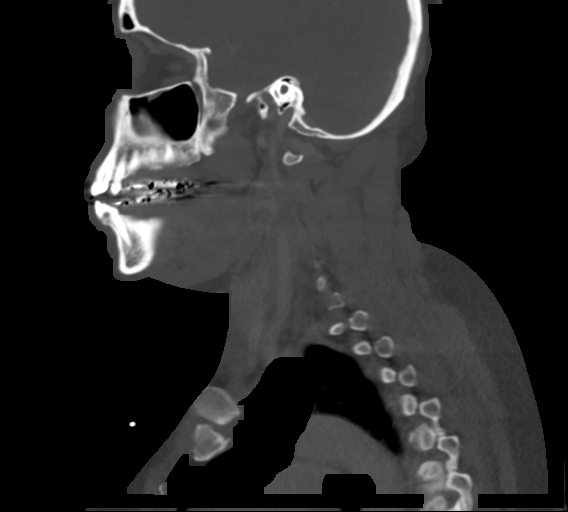

[Series 6: neck 2.0 st · coronal · 0.53mm/px · 3 of 146 slices shown (3 of 3)]
[im 30/146  bone]
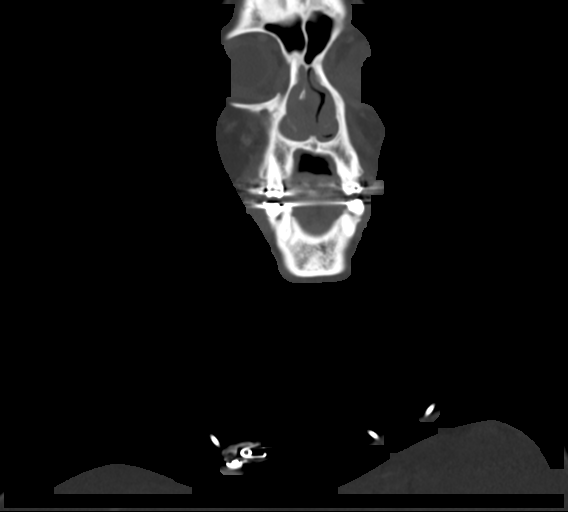
[im 59/146  bone]
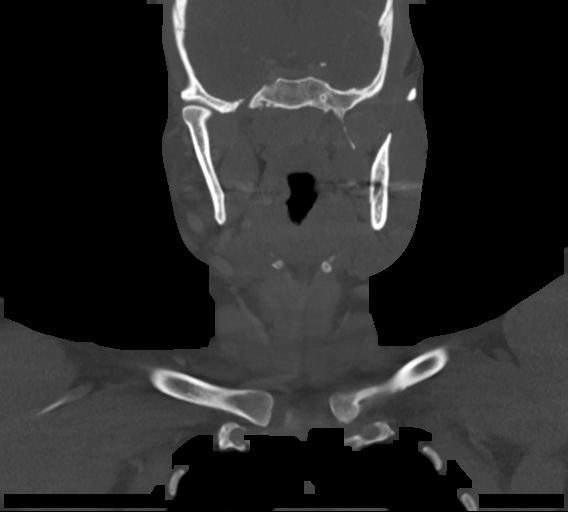
[im 88/146  bone]
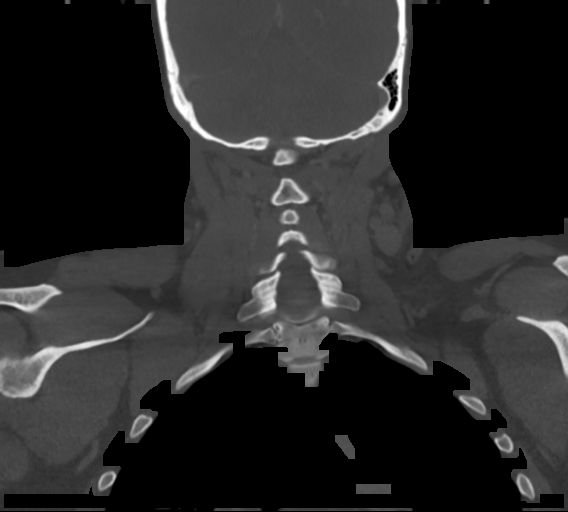

[13 of 33 positions shown; findings below may reference images not displayed]

FINDINGS: PHARYNX AND LARYNX:

--Nasopharynx: The adenoid tonsils are enlarged.

--Oral cavity and oropharynx: The palatine and lingual tonsils are
mildly prominent. The visible oral cavity and floor of mouth are
normal.

--Hypopharynx: Normal vallecula and pyriform sinuses.

--Larynx: Normal epiglottis and pre-epiglottic space. Normal
aryepiglottic and vocal folds.

--Retropharyngeal space: No abscess, effusion or lymphadenopathy.

SALIVARY GLANDS:

--Parotid: No mass lesion or inflammation. No sialolithiasis or
ductal dilatation.

--Submandibular: Symmetric without inflammation. No sialolithiasis
or ductal dilatation.

--Sublingual: Normal. No ranula or other visible lesion of the base
of tongue and floor of mouth.

THYROID: Normal.

LYMPH NODES: There is multilevel cervical lymphadenopathy. Right
level 2A nodes measure up to 12 mm. Left level 5 a nodes measure up
to 16 mm. Right level 5 B nodes measure 15 mm.

VASCULAR: Major cervical vessels are patent.

LIMITED INTRACRANIAL: Normal.

VISUALIZED ORBITS: Normal.

MASTOIDS AND VISUALIZED PARANASAL SINUSES: There is mucosal
thickening within the right nasal cavity. Mastoids and paranasal
sinuses are free of fluid.

SKELETON: No bony spinal canal stenosis. No lytic or blastic
lesions.

UPPER CHEST: Clear.

OTHER: None.
IMPRESSION: Extensive multilevel bilateral cervical lymphadenopathy and diffuse
tonsillar prominence, concerning for lymphoma. Infectious
tonsillopharyngitis with reactive lymphadenopathy is less likely in
this age group and typically does not cause such widespread and
bilateral lymphadenopathy.

ADDENDUM:
I spoke to Dr. Dleke at [DATE] on 03/15/2018. The patient has
skin exam findings suggestive of cellulitis. On further review of
the scan, there is mild subcutaneous fat induration surrounding the
nose. In this context, the hyperenhancement of the right nasal
cavity described above likely indicates an infectious process. No
osseous destruction within the nasal cavity.

The degree of lymphadenopathy still seems out of proportion to an
infectious process of the face or upper aerodigestive tract,
particularly given the involvement of levels 5A/5B. Histologic
sampling or repeat imaging following resolution of current
infectious symptoms might be helpful.

*** End of Addendum ***

## 2022-10-03 ENCOUNTER — Ambulatory Visit: Payer: 59 | Admitting: Podiatry

## 2022-10-08 ENCOUNTER — Ambulatory Visit: Payer: 59 | Admitting: Podiatry

## 2022-10-08 ENCOUNTER — Ambulatory Visit (INDEPENDENT_AMBULATORY_CARE_PROVIDER_SITE_OTHER): Payer: 59

## 2022-10-08 DIAGNOSIS — M722 Plantar fascial fibromatosis: Secondary | ICD-10-CM

## 2022-10-08 DIAGNOSIS — M66872 Spontaneous rupture of other tendons, left ankle and foot: Secondary | ICD-10-CM

## 2022-10-08 DIAGNOSIS — Q666 Other congenital valgus deformities of feet: Secondary | ICD-10-CM | POA: Diagnosis not present

## 2022-10-08 DIAGNOSIS — S93692A Other sprain of left foot, initial encounter: Secondary | ICD-10-CM | POA: Diagnosis not present

## 2022-10-08 NOTE — Progress Notes (Signed)
Subjective:  Patient ID: Megan Gardner, female    DOB: 09-14-1965,  MRN: [id number] HPI Chief Complaint  Patient presents with   Plantar Fasciitis    new pt-surgical consult for plantar fasciitis L-non req-Megan Gardner, DPM refer. Patient has been having sharp pain, since November 2023. Patient has completed physical therapy and has had  steroid shots. Patient hasn't really seen any relieve from anything. Aggravating factors would be waking up in the morning the foot is stiff and after long periods of being on her foot. Treatment has iced, soaked and over the counter medication to help with the pain.     57 y.o. female presents with the above complaint.   ROS: Denies fever chills nausea vomit muscle aches pains calf pain back pain chest pain shortness of breath  Past Medical History:  Diagnosis Date   Asthma    Back pain    Carpal tunnel syndrome    left wrist   Fibroids    Vitamin D deficiency    Past Surgical History:  Procedure Laterality Date   CHOLECYSTECTOMY N/A 11/04/2019   Procedure: LAPAROSCOPIC CHOLECYSTECTOMY;  Surgeon: Diamantina Monks, MD;  Location: [facility] OR;  Service: General;  Laterality: N/A;   MYOMECTOMY      Current Outpatient Medications:    losartan-hydrochlorothiazide (HYZAAR) 50-12.5 MG tablet, Take 1 tablet by mouth every morning., Disp: , Rfl:    acetaminophen (TYLENOL) 500 MG tablet, Take 2 tablets (1,000 mg total) by mouth every 6 (six) hours as needed for mild pain or fever., Disp: , Rfl:    cholecalciferol (VITAMIN D) 1000 units tablet, Take 1,000 Units by mouth daily., Disp: , Rfl:    Multiple Vitamin (MULTIVITAMIN) tablet, Take 1 tablet by mouth daily., Disp: , Rfl:    mupirocin ointment (BACTROBAN) 2 %, Place 1 application into the nose 2 (two) times daily. X 1 week (Patient not taking: Reported on 11/03/2019), Disp: 30 g, Rfl: 0   oxyCODONE (OXY IR/ROXICODONE) 5 MG immediate release tablet, Take 1-2 tablets (5-10 mg total) by mouth every 6 (six) hours as  needed for moderate pain or severe pain. (Patient not taking: Reported on 10/08/2022), Disp: 20 tablet, Rfl: 0  No Known Allergies Review of Systems Objective:  There were no vitals filed for this visit.  General: Well developed, nourished, in no acute distress, alert and oriented x3   Dermatological: Skin is warm, dry and supple bilateral. Nails x 10 are well maintained; remaining integument appears unremarkable at this time. There are no open sores, no preulcerative lesions, no rash or signs of infection present.  Vascular: Dorsalis Pedis artery and Posterior Tibial artery pedal pulses are 2/4 bilateral with immedate capillary fill time. Pedal hair growth present. No varicosities and no lower extremity edema present bilateral.   Neruologic: Grossly intact via light touch bilateral. Vibratory intact via tuning fork bilateral. Protective threshold with Semmes Wienstein monofilament intact to all pedal sites bilateral. Patellar and Achilles deep tendon reflexes 2+ bilateral. No Babinski or clonus noted bilateral.   Musculoskeletal: No gross boney pedal deformities bilateral. No pain, crepitus, or limitation noted with foot and ankle range of motion bilateral. Muscular strength 5/5 in all groups tested bilateral.  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  She has inversion against resistance but minimally Gardner exquisitely painful.  The posterior tibial tendon is minimally palpable due to possibly to the edema but  possibly secondary to rupture.  She has severe pain on palpation of the plantar fascia which she has been treated multiple x 4.  All conservative therapies have failed to render this patient asymptomatic.  Gait: Unassisted, Nonantalgic.    Radiographs:  Radiographs taken today demonstrate osseously mature individual with generalized demineralization of the  midfoot.  A large navicular tuberosity is prominent.  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  She also has osteoarthritic change at the first metatarsophalangeal joint with joint space narrowing subchondral sclerosis and eburnation.  She also has's plantar distally oriented calcaneal heel spur with soft tissue increase in density of plantar fascial kidney insertion site.  Assessment & Plan:   Assessment: Posterior tibial tendon dysfunction cannot rule out a complete tear or rupture of the posterior tibial tendon severe pes planovalgus with osteoarthritic change and fluid collection of the posterior tibial tendon sheath.  Possible tear of the plantar fascia as well.  Plan: Currently request an MRI of that left foot evaluating posterior tibial tendon tear and plantar fascial tear.  This is for differential diagnoses and surgical planning.     Megan Gardner T. Megan Gardner, [place]

## 2022-10-11 ENCOUNTER — Encounter: Payer: Self-pay | Admitting: Podiatry

## 2022-10-13 ENCOUNTER — Other Ambulatory Visit: Payer: BC Managed Care – PPO

## 2024-01-15 ENCOUNTER — Other Ambulatory Visit: Payer: Self-pay

## 2024-01-15 ENCOUNTER — Encounter: Payer: Self-pay | Admitting: *Deleted

## 2024-01-15 ENCOUNTER — Ambulatory Visit
Admission: EM | Admit: 2024-01-15 | Discharge: 2024-01-15 | Disposition: A | Discharge status: Home / Self Care | Attending: Nurse Practitioner | Admitting: Nurse Practitioner

## 2024-01-15 DIAGNOSIS — L0231 Cutaneous abscess of buttock: Secondary | ICD-10-CM | POA: Insufficient documentation

## 2024-01-15 HISTORY — DX: Essential (primary) hypertension: I10

## 2024-01-15 MED ORDER — SULFAMETHOXAZOLE-TRIMETHOPRIM 800-160 MG PO TABS: 1.0000 | ORAL_TABLET | Freq: Two times a day (BID) | ORAL | 0 refills | Status: AC

## 2024-01-15 NOTE — Discharge Instructions (Addendum)
 You were seen today for an abscess.  An abscess is an infection under the skin that can happen anywhere on the body. If not treated, it can get worse and cause a more serious infection. Today, a procedure called an I&D, which stands for incision and drainage, was performed. This means a small cut was made to let the pus drain out. The area was cleaned thoroughly, and packing was placed inside the open area to help it heal properly. This packing should stay in place until you return for your follow-up visit. A sample of the drainage was sent to the lab to check for any bacteria. You have been prescribed antibiotics. Be sure to take them exactly as directed, and always take them with food to help avoid an upset stomach. Use warm, moist compresses on the area to help with healing.  Watch the area closely for any signs that the infection is getting worse, such as increased redness, swelling, pain, pus, or fever. If you notice any of these symptoms, return to urgent care or go to the ED right away.

## 2024-01-15 NOTE — ED Provider Notes (Signed)
 [facility]    CSN: [id number] Arrival date & time: 01/15/24  1616      History   Chief Complaint Chief Complaint  Patient presents with   Abscess    HPI Megan Gardner is a 58 y.o. female.   Discussed the use of AI scribe software for clinical note transcription with the patient, who gave verbal consent to proceed.   The patient presents with an abscess on their buttocks, specifically near the tailbone, that has been present for over a week. The abscess is causing significant pain and discomfort, preventing the patient from sitting.  The patient reports that the abscess has been painful and has not drained any fluid that they are aware of. They have attempted home remedies, including warm compresses  and applying a substance called PRID (pain relief and irritant drawing) for the past two days. The patient also mentions using fatback on the abscess the night before the visit, citing it as an old home remedy.  The patient denies any associated symptoms such as fever, chills, or body aches. The patient denies history of DM.   The following sections of the patient's history were reviewed and updated as appropriate: allergies, current medications, past family history, past medical history, past social history, past surgical history, and problem list.      Past Medical History:  Diagnosis Date   Asthma    Back pain    Carpal tunnel syndrome    left wrist   Fibroids    Hypertension    Vitamin D  deficiency     Patient Active Problem List   Diagnosis Date Noted   Cholecystitis 11/03/2019   Facial cellulitis 03/15/2018   Vitamin D  deficiency 10/22/2014   Carpal tunnel syndrome of left wrist 10/22/2014   Transaminitis 10/22/2014    Past Surgical History:  Procedure Laterality Date   CHOLECYSTECTOMY N/A 11/04/2019   Procedure: LAPAROSCOPIC CHOLECYSTECTOMY;  Surgeon: Paola Dreama SAILOR, MD;  Location: [facility] OR;  Service: General;  Laterality: N/A;   MYOMECTOMY       OB History   No obstetric history on file.      Home Medications    Prior to Admission medications   Medication Sig Start Date End Date Taking? Authorizing Provider  cholecalciferol (VITAMIN D ) 1000 units tablet Take 1,000 Units by mouth daily.   Yes [provider]  losartan-hydrochlorothiazide (HYZAAR) 50-12.5 MG tablet Take 1 tablet by mouth every morning. 09/30/22  Yes [provider]  Multiple Vitamin (MULTIVITAMIN) tablet Take 1 tablet by mouth daily.   Yes [provider]  sulfamethoxazole -trimethoprim  (BACTRIM  DS) 800-160 MG tablet Take 1 tablet by mouth 2 (two) times daily for 7 days. 01/15/24 01/22/24 Yes Iola Lukes, FNP    Family History Family History  Problem Relation Age of Onset   Colon cancer Neg Hx    Colon polyps Neg Hx    Esophageal cancer Neg Hx    Rectal cancer Neg Hx    Stomach cancer Neg Hx     Social History Social History   Tobacco Use   Smoking status: Never   Smokeless tobacco: Never  Vaping Use   Vaping status: Never Used  Substance Use Topics   Alcohol use: Yes    Alcohol/week: 1.0 standard drink of alcohol    Types: 1 Glasses of wine per week    Comment: occassionally   Drug use: No     Allergies   Patient has no known allergies.   Review of Systems  Review of Systems  Constitutional:  Negative for chills and fever.  Musculoskeletal:  Negative for myalgias.  Skin:  Negative for wound.  All other systems reviewed and are negative.    Physical Exam Triage Vital Signs ED Triage Vitals  Encounter Vitals Group     BP 01/15/24 1707 (!) 158/103     Girls Systolic BP Percentile --      Girls Diastolic BP Percentile --      Boys Systolic BP Percentile --      Boys Diastolic BP Percentile --      Pulse Rate 01/15/24 1707 80     Resp 01/15/24 1707 18     Temp 01/15/24 1707 98.8 F (37.1 C)     Temp Source 01/15/24 1707 Oral     SpO2 01/15/24 1707 96 %     Weight --      Height --      Head  Circumference --      Peak Flow --      Pain Score 01/15/24 1708 8     Pain Loc --      Pain Education --      Exclude from Growth Chart --    No data found.  Updated Vital Signs BP (!) 158/103 (BP Location: Left Arm)   Pulse 80   Temp 98.8 F (37.1 C) (Oral)   Resp 18   SpO2 96%   Visual Acuity Right Eye Distance:   Left Eye Distance:   Bilateral Distance:    Right Eye Near:   Left Eye Near:    Bilateral Near:     Physical Exam Vitals reviewed. Exam conducted with a chaperone present (Joss, RN).  Constitutional:      General: She is awake. She is not in acute distress.    Appearance: Normal appearance. She is well-developed. She is not ill-appearing, toxic-appearing or diaphoretic.  HENT:     Head: Normocephalic.     Right Ear: Hearing normal.     Left Ear: Hearing normal.     Nose: Nose normal.     Mouth/Throat:     Mouth: Mucous membranes are moist.  Eyes:     General: Vision grossly intact.     Conjunctiva/sclera: Conjunctivae normal.  Cardiovascular:     Rate and Rhythm: Normal rate and regular rhythm.     Heart sounds: Normal heart sounds.  Pulmonary:     Effort: Pulmonary effort is normal.     Breath sounds: Normal breath sounds and air entry.  Musculoskeletal:        General: Normal range of motion.     Cervical back: Normal range of motion and neck supple.  Skin:    General: Skin is warm and dry.     Findings: Abscess present.         Comments: An abscess is present on the inner right buttock, located lateral to the intergluteal cleft, with underlying fluctuance and surrounding erythema and tenderness.  Neurological:     General: No focal deficit present.     Mental Status: She is alert and oriented to person, place, and time.  Psychiatric:        Speech: Speech normal.        Behavior: Behavior is cooperative.      UC Treatments / Results  Labs (all labs ordered are listed, but only abnormal results are displayed) Labs Reviewed  AEROBIC  CULTURE W GRAM STAIN (SUPERFICIAL SPECIMEN)    EKG   Radiology No  results found.  Procedures Incision and Drainage  Date/Time: 01/15/2024 6:27 PM  Performed by: Iola Lukes, FNP Authorized by: Iola Lukes, FNP   Consent:    Consent obtained:  Verbal   Consent given by:  Patient   Risks, benefits, and alternatives were discussed: yes     Risks discussed:  Bleeding, incomplete drainage and pain Universal protocol:    Patient identity confirmed:  Verbally with patient and arm band Location:    Type:  Abscess   Location:  Anogenital   Anogenital location:  Gluteal cleft Pre-procedure details:    Skin preparation:  Chlorhexidine with alcohol and povidone-iodine Anesthesia:    Anesthesia method:  Local infiltration   Local anesthetic:  Lidocaine  1% w/o epi Procedure type:    Complexity:  Complex Procedure details:    Incision types:  Single straight   Drainage:  Purulent   Drainage amount:  Copious   Packing materials:  1/4 in iodoform gauze Post-procedure details:    Procedure completion:  Tolerated well, no immediate complications  (including critical care time)  Medications Ordered in UC Medications - No data to display  Initial Impression / Assessment and Plan / UC Course  I have reviewed the triage vital signs and the nursing notes.  Pertinent labs & imaging results that were available during my care of the patient were reviewed by me and considered in my medical decision making (see chart for details).     The patient presented with a skin abscess. Incision and drainage were performed in clinic with return of purulent material, and a wound culture was obtained. Empiric antibiotics were initiated. Wound care instructions were provided, and strict return precautions for signs of worsening infection were reviewed. The patient will follow up in two days for packing removal and wound reassessment.  Today's evaluation has revealed no signs of a dangerous  process. Discussed diagnosis with patient and/or guardian. Patient and/or guardian aware of their diagnosis, possible red flag symptoms to watch out for and need for close follow up. Patient and/or guardian understands verbal and written discharge instructions. Patient and/or guardian comfortable with plan and disposition.  Patient and/or guardian has a clear mental status at this time, good insight into illness (after discussion and teaching) and has clear judgment to make decisions regarding their care  Documentation was completed with the aid of voice recognition software. Transcription may contain typographical errors.  Final Clinical Impressions(s) / UC Diagnoses   Final diagnoses:  Abscess of right buttock     Discharge Instructions      You were seen today for an abscess.  An abscess is an infection under the skin that can happen anywhere on the body. If not treated, it can get worse and cause a more serious infection. Today, a procedure called an I&D, which stands for incision and drainage, was performed. This means a small cut was made to let the pus drain out. The area was cleaned thoroughly, and packing was placed inside the open area to help it heal properly. This packing should stay in place until you return for your follow-up visit. A sample of the drainage was sent to the lab to check for any bacteria. You have been prescribed antibiotics. Be sure to take them exactly as directed, and always take them with food to help avoid an upset stomach. Use warm, moist compresses on the area to help with healing.  Watch the area closely for any signs that the infection is getting worse, such as increased redness,  swelling, pain, pus, or fever. If you notice any of these symptoms, return to urgent care or go to the ED right away.      ED Prescriptions     Medication Sig Dispense Auth. Provider   sulfamethoxazole -trimethoprim  (BACTRIM  DS) 800-160 MG tablet Take 1 tablet by mouth 2 (two)  times daily for 7 days. 14 tablet Iola Lukes, FNP      PDMP not reviewed this encounter.   Iola Lukes, [place] 01/15/24 [id number]

## 2024-01-15 NOTE — ED Triage Notes (Signed)
 Pt reports abscess/cyst on tailbone for over a week. States it feels smaller but is still as painful. She took tylenol  today around 1pm. Reports hx of same that had to be drained about 15 years ago. She is a [employer] doctor.

## 2024-01-17 ENCOUNTER — Encounter: Payer: Self-pay | Admitting: Emergency Medicine

## 2024-01-17 ENCOUNTER — Ambulatory Visit: Admission: EM | Admit: 2024-01-17 | Discharge: 2024-01-17 | Disposition: A | Discharge status: Home / Self Care

## 2024-01-17 ENCOUNTER — Ambulatory Visit: Payer: Self-pay | Admitting: Nurse Practitioner

## 2024-01-17 ENCOUNTER — Ambulatory Visit: Payer: Self-pay

## 2024-01-17 DIAGNOSIS — Z5189 Encounter for other specified aftercare: Secondary | ICD-10-CM

## 2024-01-17 NOTE — ED Provider Notes (Signed)
 [facility]    CSN: [id number] Arrival date & time: 01/17/24  1500      History   Chief Complaint Chief Complaint  Patient presents with   Wound recheck of abscess    HPI Megan Gardner is a 58 y.o. female.   The patient presents for recheck of an abscess after undergoing incision and drainage on 01/15/24. Wound cultures were obtained at that time and are still pending. The patient reports tolerating antibiotics well without adverse effects. Patient denies fever, chills, or other systemic symptoms since the procedure. Patient is here today for packing removal, evaluation of wound healing and to ensure there are no signs of worsening infection.  The following sections of the patient's history were reviewed and updated as appropriate: allergies, current medications, past family history, past medical history, past social history, past surgical history, and problem list.      Past Medical History:  Diagnosis Date   Asthma    Back pain    Carpal tunnel syndrome    left wrist   Fibroids    Hypertension    Vitamin D  deficiency     Patient Active Problem List   Diagnosis Date Noted   Cholecystitis 11/03/2019   Facial cellulitis 03/15/2018   Vitamin D  deficiency 10/22/2014   Carpal tunnel syndrome of left wrist 10/22/2014   Transaminitis 10/22/2014    Past Surgical History:  Procedure Laterality Date   CHOLECYSTECTOMY N/A 11/04/2019   Procedure: LAPAROSCOPIC CHOLECYSTECTOMY;  Surgeon: Paola Dreama SAILOR, MD;  Location: [facility] OR;  Service: General;  Laterality: N/A;   MYOMECTOMY      OB History   No obstetric history on file.      Home Medications    Prior to Admission medications   Medication Sig Start Date End Date Taking? Authorizing Provider  cholecalciferol (VITAMIN D ) 1000 units tablet Take 1,000 Units by mouth daily.    [provider]  losartan-hydrochlorothiazide (HYZAAR) 50-12.5 MG tablet Take 1 tablet by mouth every morning. 09/30/22    [provider]  Multiple Vitamin (MULTIVITAMIN) tablet Take 1 tablet by mouth daily.    [provider]  sulfamethoxazole -trimethoprim  (BACTRIM  DS) 800-160 MG tablet Take 1 tablet by mouth 2 (two) times daily for 7 days. 01/15/24 01/22/24  Iola Lukes, FNP    Family History Family History  Problem Relation Age of Onset   Colon cancer Neg Hx    Colon polyps Neg Hx    Esophageal cancer Neg Hx    Rectal cancer Neg Hx    Stomach cancer Neg Hx     Social History Social History   Tobacco Use   Smoking status: Never   Smokeless tobacco: Never  Vaping Use   Vaping status: Never Used  Substance Use Topics   Alcohol use: Yes    Alcohol/week: 1.0 standard drink of alcohol    Types: 1 Glasses of wine per week    Comment: occassionally   Drug use: No     Allergies   Patient has no known allergies.   Review of Systems Review of Systems  Constitutional:  Negative for fever.  Gastrointestinal:  Negative for nausea and vomiting.  Skin:  Positive for wound.  All other systems reviewed and are negative.    Physical Exam Triage Vital Signs ED Triage Vitals [01/17/24 1523]  Encounter Vitals Group     BP (!) 138/91     Girls Systolic BP Percentile      Girls Diastolic BP Percentile  Boys Systolic BP Percentile      Boys Diastolic BP Percentile      Pulse Rate 88     Resp 18     Temp 98 F (36.7 C)     Temp Source Oral     SpO2 95 %     Weight      Height      Head Circumference      Peak Flow      Pain Score      Pain Loc      Pain Education      Exclude from Growth Chart    No data found.  Updated Vital Signs BP (!) 138/91 (BP Location: Left Arm)   Pulse 88   Temp 98 F (36.7 C) (Oral)   Resp 18   LMP  (Exact Date)   SpO2 95%   Visual Acuity Right Eye Distance:   Left Eye Distance:   Bilateral Distance:    Right Eye Near:   Left Eye Near:    Bilateral Near:     Physical Exam Vitals reviewed.  Constitutional:       General: She is awake. She is not in acute distress.    Appearance: Normal appearance. She is well-developed. She is not ill-appearing, toxic-appearing or diaphoretic.  HENT:     Head: Normocephalic.     Right Ear: Hearing normal.     Left Ear: Hearing normal.     Nose: Nose normal.     Mouth/Throat:     Mouth: Mucous membranes are moist.  Eyes:     General: Vision grossly intact.     Conjunctiva/sclera: Conjunctivae normal.  Cardiovascular:     Rate and Rhythm: Normal rate and regular rhythm.     Heart sounds: Normal heart sounds.  Pulmonary:     Effort: Pulmonary effort is normal.     Breath sounds: Normal breath sounds and air entry.  Musculoskeletal:        General: Normal range of motion.     Cervical back: Normal range of motion and neck supple.  Skin:    General: Skin is warm and dry.     Findings: Abscess present.     Comments: Packing from the right buttock abscess was removed without difficulty. The wound has spontaneous drainage of foul-smelling, thin serosanguineous fluid, with no surrounding erythema observed.   Neurological:     General: No focal deficit present.     Mental Status: She is alert and oriented to person, place, and time.  Psychiatric:        Speech: Speech normal.        Behavior: Behavior is cooperative.      UC Treatments / Results  Labs (all labs ordered are listed, but only abnormal results are displayed) Labs Reviewed - No data to display  EKG   Radiology No results found.  Procedures Procedures (including critical care time)  Medications Ordered in UC Medications - No data to display  Initial Impression / Assessment and Plan / UC Course  I have reviewed the triage vital signs and the nursing notes.  Pertinent labs & imaging results that were available during my care of the patient were reviewed by me and considered in my medical decision making (see chart for details).     Patient returns for recheck of right abscess status  post incision and drainage on 01/15/24. She has been tolerating antibiotics without adverse effects and denies fever, chills, or other systemic symptoms. Packing was  removed in clinic today with spontaneous foul-smelling serosanguineous drainage from the wound. No surrounding erythema or signs of spreading infection were noted. A dry dressing was applied, and wound care instructions were reviewed. Patient was advised to complete her prescribed antibiotic course. Wound culture results are still pending, and she will be contacted only if a change in therapy is required; otherwise, results may be reviewed through MyChart. Follow-up as needed for any worsening symptoms or concerns.  Today's evaluation has revealed no signs of a dangerous process. Discussed diagnosis with patient and/or guardian. Patient and/or guardian aware of their diagnosis, possible red flag symptoms to watch out for and need for close follow up. Patient and/or guardian understands verbal and written discharge instructions. Patient and/or guardian comfortable with plan and disposition.  Patient and/or guardian has a clear mental status at this time, good insight into illness (after discussion and teaching) and has clear judgment to make decisions regarding their care  Documentation was completed with the aid of voice recognition software. Transcription may contain typographical errors.  Final Clinical Impressions(s) / UC Diagnoses   Final diagnoses:  Wound check, abscess     Discharge Instructions      You were seen today for a recheck of your abscess after it was drained on 01/15/24. The packing was removed, and some drainage was noted from the wound. There are no signs of spreading infection, and a clean dry dressing was placed. You should continue taking your antibiotics exactly as prescribed until they are finished, even if you feel better. At home, keep the area clean and dry, and change the dressing if it becomes wet or soiled.  Wash your hands before and after touching the wound. Mild drainage is expected as the site continues to heal. If you have pain, you may use over-the-counter Tylenol  or ibuprofen. Your wound culture results are still pending. You will only be notified if a change in antibiotics is needed, otherwise you may review the results on your MyChart account. Please follow up with your primary care provider if your wound does not seem to be healing, if the drainage increases or develops a strong odor, or if you have concerns about wound care. Go to the emergency department right away if you develop fever, chills, worsening pain, spreading redness, swelling around the wound, or if you feel very unwell.      ED Prescriptions   None    PDMP not reviewed this encounter.   Iola Lukes, [place] 01/17/24 [phone]

## 2024-01-17 NOTE — Discharge Instructions (Addendum)
 You were seen today for a recheck of your abscess after it was drained on 01/15/24. The packing was removed, and some drainage was noted from the wound. There are no signs of spreading infection, and a clean dry dressing was placed. You should continue taking your antibiotics exactly as prescribed until they are finished, even if you feel better. At home, keep the area clean and dry, and change the dressing if it becomes wet or soiled. Wash your hands before and after touching the wound. Mild drainage is expected as the site continues to heal. If you have pain, you may use over-the-counter Tylenol  or ibuprofen. Your wound culture results are still pending. You will only be notified if a change in antibiotics is needed, otherwise you may review the results on your MyChart account. Please follow up with your primary care provider if your wound does not seem to be healing, if the drainage increases or develops a strong odor, or if you have concerns about wound care. Go to the emergency department right away if you develop fever, chills, worsening pain, spreading redness, swelling around the wound, or if you feel very unwell.

## 2024-01-17 NOTE — ED Triage Notes (Signed)
 Pt returns today for recheck and packing removal of packing in abscess on tailbone  Pt st's area feels much better

## 2024-01-19 LAB — AEROBIC CULTURE W GRAM STAIN (SUPERFICIAL SPECIMEN)
